# Patient Record
Sex: Female | Born: 1980 | Race: White | Hispanic: No | Marital: Married | Smoking: Never smoker
Health system: Southern US, Community
[De-identification: ages and names within clinical notes are randomized; demographics above are authoritative.]

## PROBLEM LIST (undated history)

## (undated) ENCOUNTER — Inpatient Hospital Stay (HOSPITAL_COMMUNITY): Payer: Self-pay

## (undated) DIAGNOSIS — R519 Headache, unspecified: Secondary | ICD-10-CM

## (undated) DIAGNOSIS — F419 Anxiety disorder, unspecified: Secondary | ICD-10-CM

## (undated) DIAGNOSIS — E7212 Methylenetetrahydrofolate reductase deficiency: Secondary | ICD-10-CM

## (undated) DIAGNOSIS — K649 Unspecified hemorrhoids: Secondary | ICD-10-CM

## (undated) DIAGNOSIS — E119 Type 2 diabetes mellitus without complications: Secondary | ICD-10-CM

## (undated) DIAGNOSIS — E282 Polycystic ovarian syndrome: Secondary | ICD-10-CM

## (undated) DIAGNOSIS — D649 Anemia, unspecified: Secondary | ICD-10-CM

## (undated) DIAGNOSIS — K219 Gastro-esophageal reflux disease without esophagitis: Secondary | ICD-10-CM

## (undated) DIAGNOSIS — Z1589 Genetic susceptibility to other disease: Secondary | ICD-10-CM

## (undated) DIAGNOSIS — M1990 Unspecified osteoarthritis, unspecified site: Secondary | ICD-10-CM

## (undated) DIAGNOSIS — N200 Calculus of kidney: Secondary | ICD-10-CM

## (undated) DIAGNOSIS — R51 Headache: Secondary | ICD-10-CM

## (undated) DIAGNOSIS — G8929 Other chronic pain: Secondary | ICD-10-CM

## (undated) DIAGNOSIS — K5909 Other constipation: Secondary | ICD-10-CM

## (undated) DIAGNOSIS — O24419 Gestational diabetes mellitus in pregnancy, unspecified control: Secondary | ICD-10-CM

## (undated) DIAGNOSIS — R5382 Chronic fatigue, unspecified: Secondary | ICD-10-CM

## (undated) DIAGNOSIS — D126 Benign neoplasm of colon, unspecified: Secondary | ICD-10-CM

## (undated) HISTORY — DX: Other chronic pain: G89.29

## (undated) HISTORY — DX: Anemia, unspecified: D64.9

## (undated) HISTORY — DX: Headache: R51

## (undated) HISTORY — PX: MANDIBLE SURGERY: SHX707

## (undated) HISTORY — DX: Chronic fatigue, unspecified: R53.82

## (undated) HISTORY — DX: Unspecified hemorrhoids: K64.9

## (undated) HISTORY — DX: Gestational diabetes mellitus in pregnancy, unspecified control: O24.419

## (undated) HISTORY — DX: Headache, unspecified: R51.9

## (undated) HISTORY — DX: Other constipation: K59.09

## (undated) HISTORY — PX: DILATION AND CURETTAGE OF UTERUS: SHX78

## (undated) HISTORY — DX: Unspecified osteoarthritis, unspecified site: M19.90

## (undated) HISTORY — DX: Methylenetetrahydrofolate reductase deficiency: E72.12

## (undated) HISTORY — DX: Genetic susceptibility to other disease: Z15.89

## (undated) HISTORY — DX: Benign neoplasm of colon, unspecified: D12.6

## (undated) HISTORY — DX: Type 2 diabetes mellitus without complications: E11.9

## (undated) HISTORY — PX: UPPER GASTROINTESTINAL ENDOSCOPY: SHX188

## (undated) HISTORY — PX: COLONOSCOPY: SHX174

## (undated) HISTORY — DX: Anxiety disorder, unspecified: F41.9

## (undated) HISTORY — PX: KNEE ARTHROSCOPY: SUR90

## (undated) HISTORY — DX: Calculus of kidney: N20.0

---

## 1999-02-26 ENCOUNTER — Ambulatory Visit (HOSPITAL_COMMUNITY): Admission: RE | Admit: 1999-02-26 | Discharge: 1999-02-26 | Payer: Self-pay | Admitting: Family Medicine

## 1999-02-26 ENCOUNTER — Encounter: Payer: Self-pay | Admitting: Family Medicine

## 1999-10-13 ENCOUNTER — Ambulatory Visit (HOSPITAL_COMMUNITY): Admission: RE | Admit: 1999-10-13 | Discharge: 1999-10-13 | Payer: Self-pay | Admitting: Specialist

## 1999-10-13 ENCOUNTER — Encounter: Payer: Self-pay | Admitting: Specialist

## 2006-01-19 ENCOUNTER — Other Ambulatory Visit: Admission: RE | Admit: 2006-01-19 | Discharge: 2006-01-19 | Payer: Self-pay | Admitting: Obstetrics and Gynecology

## 2006-06-16 ENCOUNTER — Ambulatory Visit (HOSPITAL_COMMUNITY): Admission: RE | Admit: 2006-06-16 | Discharge: 2006-06-16 | Payer: Self-pay | Admitting: Oral Surgery

## 2007-01-27 ENCOUNTER — Other Ambulatory Visit: Admission: RE | Admit: 2007-01-27 | Discharge: 2007-01-27 | Payer: Self-pay | Admitting: Obstetrics and Gynecology

## 2007-09-30 ENCOUNTER — Ambulatory Visit: Payer: Self-pay | Admitting: Internal Medicine

## 2008-02-07 ENCOUNTER — Other Ambulatory Visit: Admission: RE | Admit: 2008-02-07 | Discharge: 2008-02-07 | Payer: Self-pay | Admitting: Obstetrics and Gynecology

## 2008-03-06 DIAGNOSIS — T7840XA Allergy, unspecified, initial encounter: Secondary | ICD-10-CM | POA: Insufficient documentation

## 2008-03-06 DIAGNOSIS — N200 Calculus of kidney: Secondary | ICD-10-CM | POA: Insufficient documentation

## 2008-03-06 DIAGNOSIS — K5900 Constipation, unspecified: Secondary | ICD-10-CM | POA: Insufficient documentation

## 2008-03-06 DIAGNOSIS — F411 Generalized anxiety disorder: Secondary | ICD-10-CM | POA: Insufficient documentation

## 2008-03-06 DIAGNOSIS — R519 Headache, unspecified: Secondary | ICD-10-CM | POA: Insufficient documentation

## 2008-03-06 DIAGNOSIS — R51 Headache: Secondary | ICD-10-CM | POA: Insufficient documentation

## 2008-03-09 ENCOUNTER — Ambulatory Visit (HOSPITAL_BASED_OUTPATIENT_CLINIC_OR_DEPARTMENT_OTHER): Admission: RE | Admit: 2008-03-09 | Discharge: 2008-03-09 | Payer: Self-pay | Admitting: Family Medicine

## 2008-03-17 ENCOUNTER — Ambulatory Visit: Payer: Self-pay | Admitting: Internal Medicine

## 2008-03-26 ENCOUNTER — Ambulatory Visit (HOSPITAL_BASED_OUTPATIENT_CLINIC_OR_DEPARTMENT_OTHER): Admission: RE | Admit: 2008-03-26 | Discharge: 2008-03-26 | Payer: Self-pay | Admitting: Family Medicine

## 2008-04-01 ENCOUNTER — Ambulatory Visit: Payer: Self-pay | Admitting: Internal Medicine

## 2008-12-28 DIAGNOSIS — R5382 Chronic fatigue, unspecified: Secondary | ICD-10-CM

## 2008-12-28 HISTORY — DX: Chronic fatigue, unspecified: R53.82

## 2009-02-12 ENCOUNTER — Other Ambulatory Visit
Admission: RE | Admit: 2009-02-12 | Discharge: 2009-02-12 | Payer: Self-pay | Admitting: Physical Medicine & Rehabilitation

## 2009-03-22 ENCOUNTER — Encounter: Admission: RE | Admit: 2009-03-22 | Discharge: 2009-03-22 | Payer: Self-pay | Admitting: Obstetrics and Gynecology

## 2009-05-01 ENCOUNTER — Encounter: Admission: RE | Admit: 2009-05-01 | Discharge: 2009-05-01 | Payer: Self-pay | Admitting: Family Medicine

## 2009-05-24 IMAGING — CT CT NECK W/ CM
4 of 5 series · 16 of 33 positions shown, 19 images · IV contrast (CONTRAST)
Comparison: None.

CLINICAL DATA: Cervical adenopathy detected on cranial MRI.

CT NECK WITH CONTRAST
TECHNIQUE: Multidetector CT imaging of the neck was performed with
intravenous contrast.
Contrast: 75 ml [id number].

[Series 3: soft · axial · 0.31mm/px · z∈[+236,+290]mm · 2 of 72 slices shown]
[im 18/72  soft-tissue]
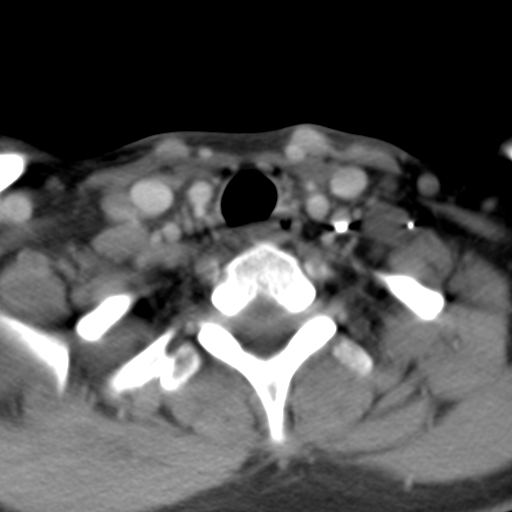
[im 36/72  soft-tissue]
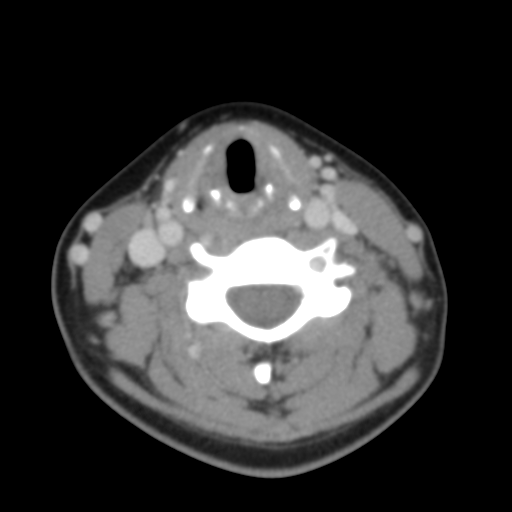

[coronals · coronal · 0.42mm/px · 3 of 69 slices shown]
[im 14/69  bone]
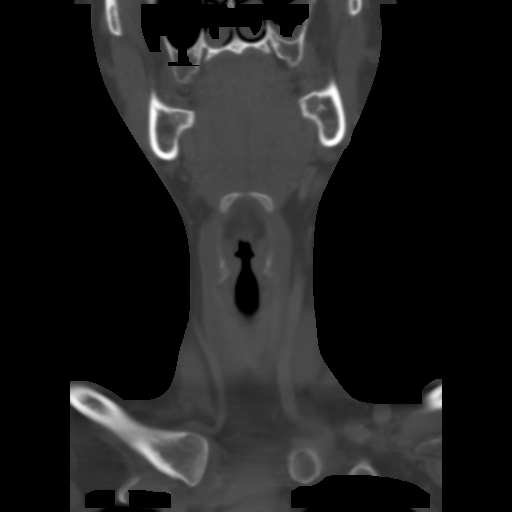
[im 28/69  bone]
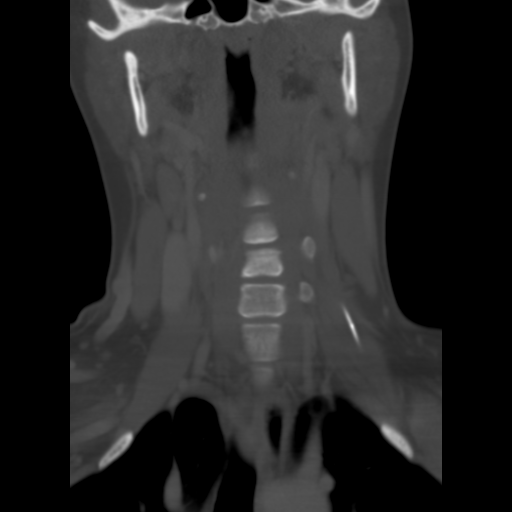
[im 41/69  bone]
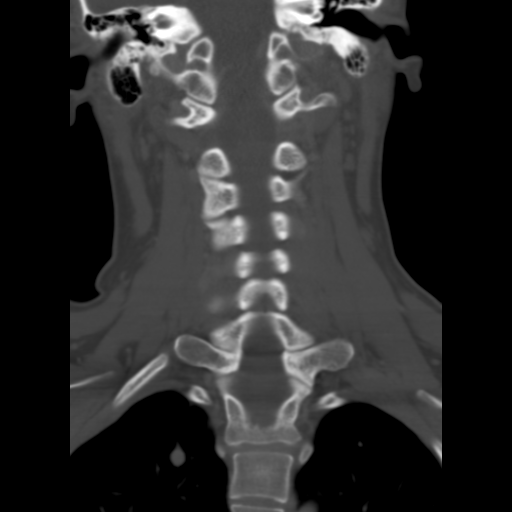

[sagitals · sagittal · 0.42mm/px · 5 of 78 slices shown, 6 images]
[im 26/78  bone]
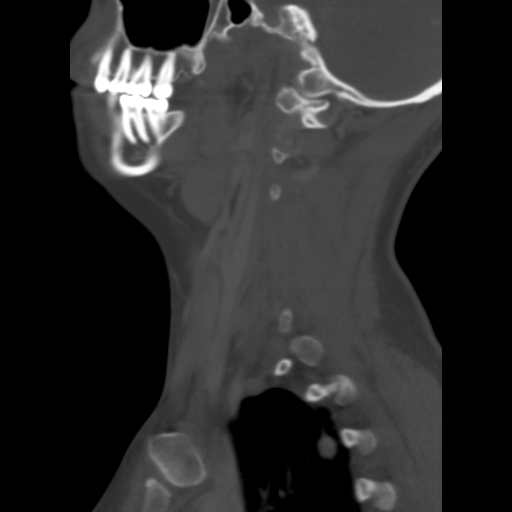
[im 33/78  bone]
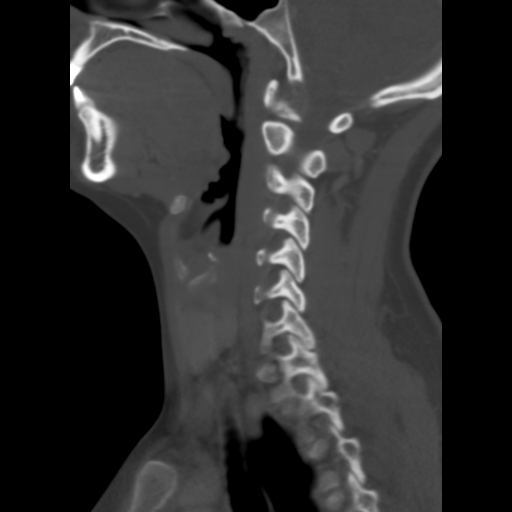
[im 39/78  soft-tissue]
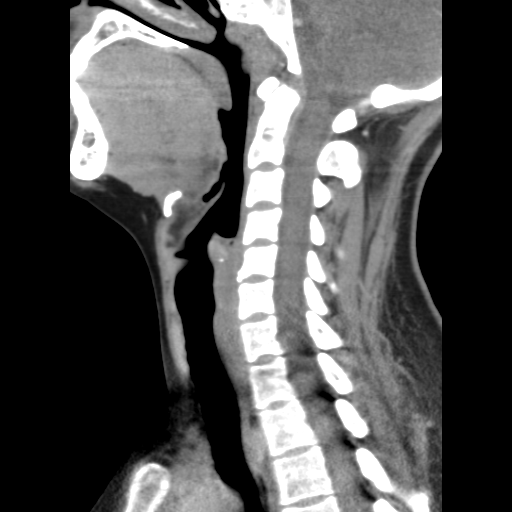
[im 39/78  bone]
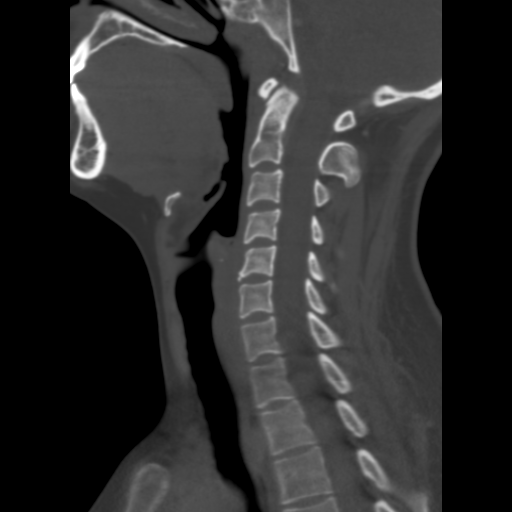
[im 45/78  bone]
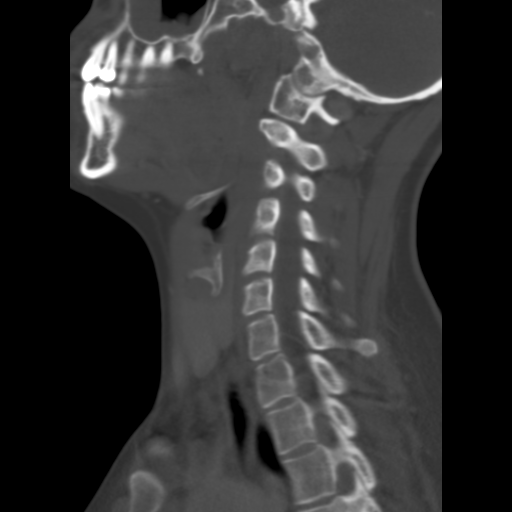
[im 52/78  bone]
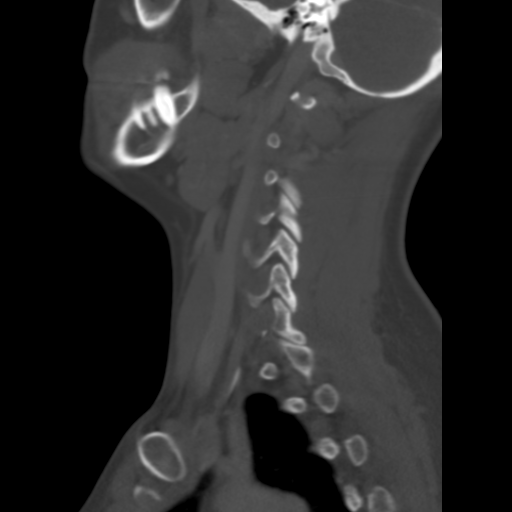

[angled axials · axial · 0.31mm/px · z∈[+209,+345]mm · 6 of 128 slices shown, 8 images]
[im 19/128  soft-tissue]
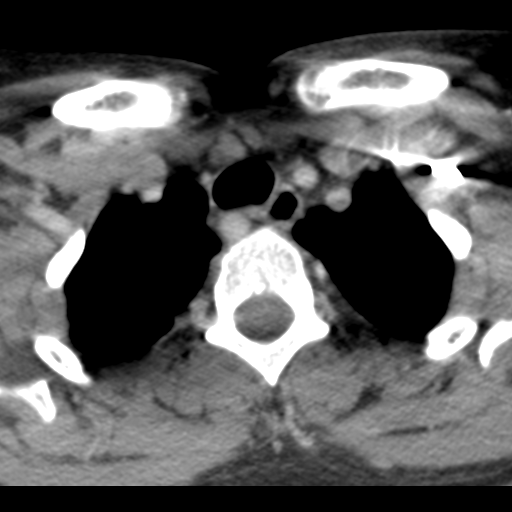
[im 19/128  bone]
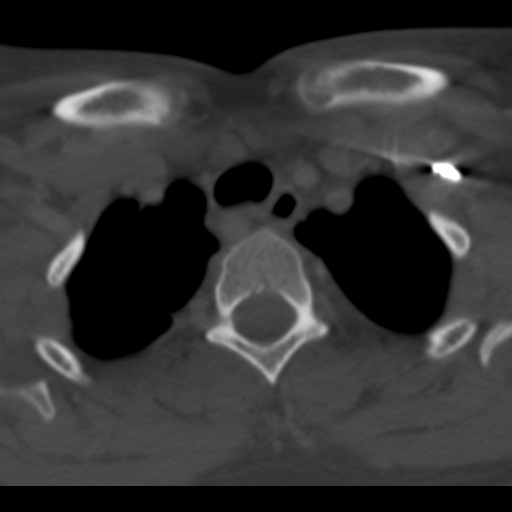
[im 37/128  bone]
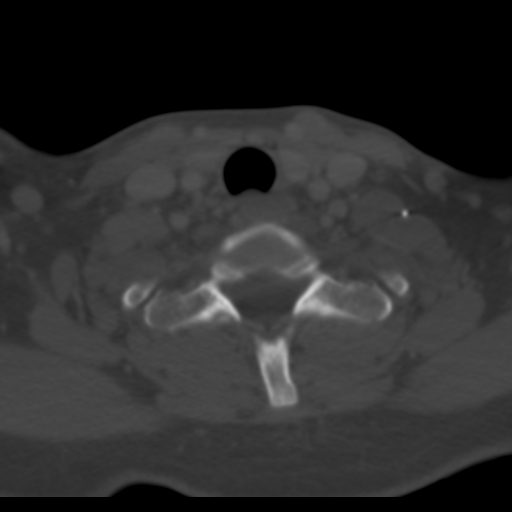
[im 55/128  bone]
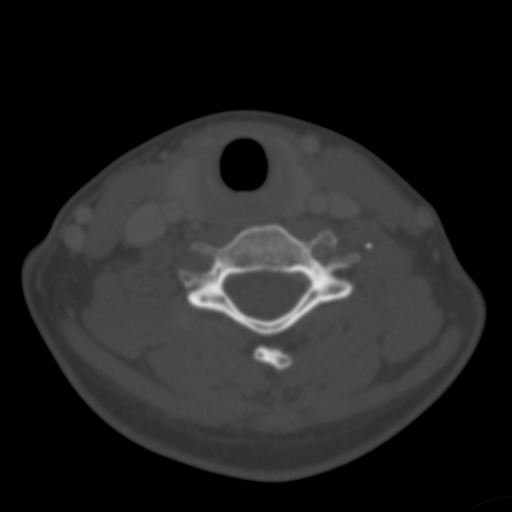
[im 73/128  bone]
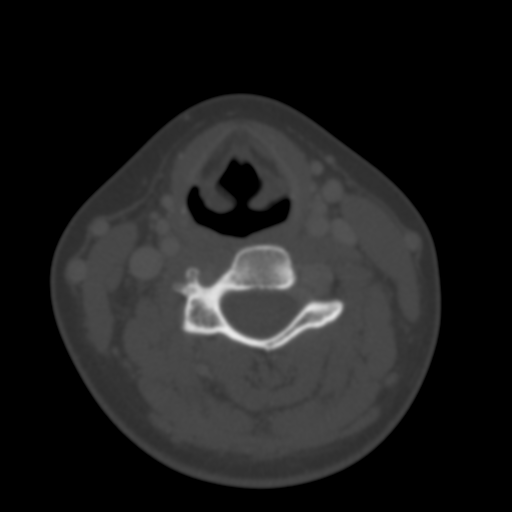
[im 91/128  soft-tissue]
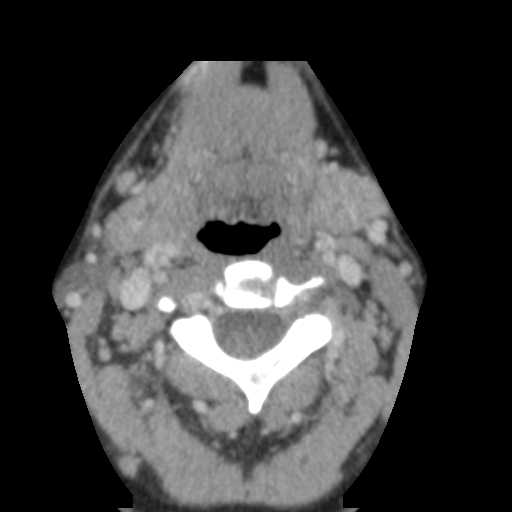
[im 91/128  bone]
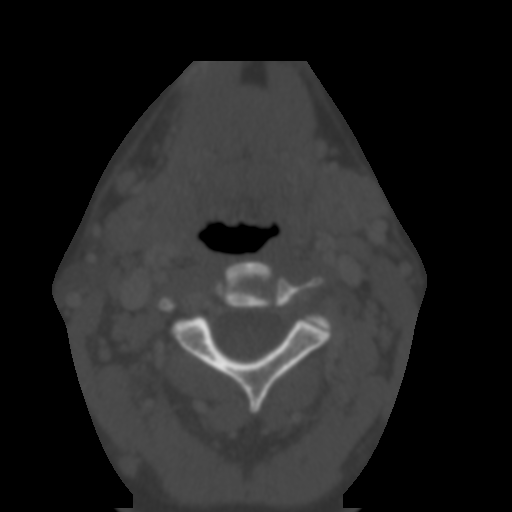
[im 109/128  bone]
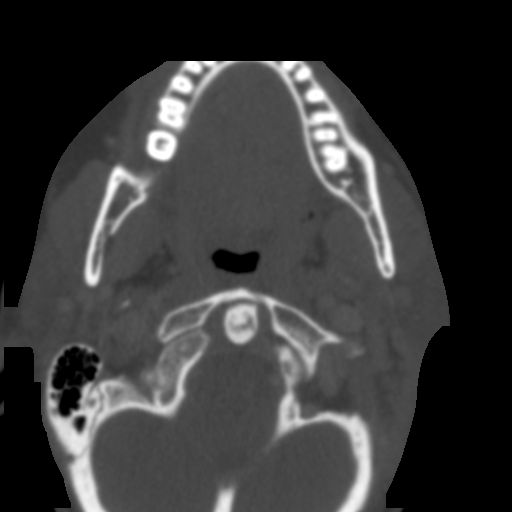

[16 of 33 positions shown; findings below may reference images not displayed]

FINDINGS: Scattered level II and level III lymph nodes.  No short
axis diameter exceeds 1 cm.  Airway widely patent.  No cervical
spondylosis.  No neck masses.  Lung apices clear.
IMPRESSION: There are no specific features which would suggest widespread level
II and level III lymphadenopathy is pathologic.  However, if there
are other areas of above which display abnormal lymphadenopathy
(axilla, inguinal), further evaluation may be warranted.  Correlate
clinically.

## 2010-02-14 ENCOUNTER — Other Ambulatory Visit: Admission: RE | Admit: 2010-02-14 | Discharge: 2010-02-14 | Payer: Self-pay | Admitting: Obstetrics and Gynecology

## 2011-02-26 ENCOUNTER — Other Ambulatory Visit (HOSPITAL_COMMUNITY)
Admission: RE | Admit: 2011-02-26 | Discharge: 2011-02-26 | Disposition: A | Discharge status: Home / Self Care | Payer: 59 | Source: Ambulatory Visit | Attending: Obstetrics and Gynecology | Admitting: Obstetrics and Gynecology

## 2011-02-26 DIAGNOSIS — Z113 Encounter for screening for infections with a predominantly sexual mode of transmission: Secondary | ICD-10-CM | POA: Insufficient documentation

## 2011-02-26 DIAGNOSIS — Z01419 Encounter for gynecological examination (general) (routine) without abnormal findings: Secondary | ICD-10-CM | POA: Insufficient documentation

## 2011-03-26 ENCOUNTER — Inpatient Hospital Stay (HOSPITAL_COMMUNITY): Admission: AD | Admit: 2011-03-26 | Payer: 59 | Source: Ambulatory Visit | Admitting: Obstetrics and Gynecology

## 2011-03-26 ENCOUNTER — Other Ambulatory Visit (HOSPITAL_COMMUNITY): Payer: Self-pay | Admitting: Obstetrics and Gynecology

## 2011-03-26 DIAGNOSIS — IMO0002 Reserved for concepts with insufficient information to code with codable children: Secondary | ICD-10-CM

## 2011-03-27 ENCOUNTER — Ambulatory Visit (HOSPITAL_COMMUNITY)
Admission: RE | Admit: 2011-03-27 | Discharge: 2011-03-27 | Disposition: A | Discharge status: Home / Self Care | Payer: 59 | Source: Ambulatory Visit | Attending: Obstetrics and Gynecology | Admitting: Obstetrics and Gynecology

## 2011-03-27 ENCOUNTER — Other Ambulatory Visit (HOSPITAL_COMMUNITY): Payer: Self-pay | Admitting: Obstetrics and Gynecology

## 2011-03-27 DIAGNOSIS — O368390 Maternal care for abnormalities of the fetal heart rate or rhythm, unspecified trimester, not applicable or unspecified: Secondary | ICD-10-CM | POA: Insufficient documentation

## 2011-03-27 DIAGNOSIS — IMO0002 Reserved for concepts with insufficient information to code with codable children: Secondary | ICD-10-CM

## 2011-03-27 DIAGNOSIS — Z3689 Encounter for other specified antenatal screening: Secondary | ICD-10-CM | POA: Insufficient documentation

## 2011-03-31 ENCOUNTER — Ambulatory Visit (HOSPITAL_COMMUNITY)
Admission: RE | Admit: 2011-03-31 | Discharge: 2011-03-31 | Disposition: A | Discharge status: Home / Self Care | Payer: 59 | Source: Ambulatory Visit | Attending: Obstetrics and Gynecology | Admitting: Obstetrics and Gynecology

## 2011-03-31 ENCOUNTER — Other Ambulatory Visit: Payer: Self-pay | Admitting: Obstetrics and Gynecology

## 2011-03-31 DIAGNOSIS — O021 Missed abortion: Secondary | ICD-10-CM | POA: Insufficient documentation

## 2011-03-31 LAB — CBC
HCT: 39 % (ref 36.0–46.0)
Hemoglobin: 13.7 g/dL (ref 12.0–15.0)
MCV: 88.2 fL (ref 78.0–100.0)
RBC: 4.42 MIL/uL (ref 3.87–5.11)
WBC: 9.3 10*3/uL (ref 4.0–10.5)

## 2011-04-15 NOTE — Op Note (Signed)
  Melinda Smith, Melinda Smith                ACCOUNT NO.:  [id number]  MEDICAL RECORD NO.:  [id number]           PATIENT TYPE:  O  LOCATION:  [facility]                          [facility]AC[facility]  PHYSICIAN:  Gerald Leitz, MD          DATE OF BIRTH:  05/12/1981  DATE OF PROCEDURE:  03/31/2011 DATE OF DISCHARGE:                              OPERATIVE REPORT   PREOPERATIVE DIAGNOSIS:  First trimester missed abortion.  POSTOPERATIVE DIAGNOSIS:  First trimester missed abortion.  PROCEDURE:  Dilation and suction curettage.  SURGEON:  Gerald Leitz, MD.  ANESTHESIA:  MAC.  ASSISTANT:  None.  FINDINGS:  Products of conception.  SPECIMEN:  Products of conception.  DISPOSITION OF SPECIMEN:  Pathology.  ESTIMATED BLOOD LOSS:  250 mL.  COMPLICATIONS:  None.  PROCEDURE:  The patient is taken to the operating room where she was placed under MAC anesthesia, placed in the dorsal lithotomy position, prepped and draped in the usual sterile fashion.  In-and-out catheterization was performed, 100 mL of urine was obtained.  Speculum was placed in the vaginal vault.  The anterior lip of the cervix was grasped with single-tooth tenaculum.  A 10 mL of 0.25% Marcaine were injected at the 4 and 8 o'clock positions.  The uterus was then sounded to 11 cm.  It was then dilated to approximately 10 mm and a 10 mm suction curette was introduced.  Suction curettage was performed with removal of suspected products of conception.  Sharp curette was then used until a gritty texture was noted and suction curette was then reintroduced and any remaining products were removed.  The patient was noted to have some moderate amount of bleeding and was given 0.2 mg of Methergine during the procedure.  At the end of procedure, the single- tooth tenaculum was removed.  Bleeding was noted and silver nitrate was applied.  The patient was taken to the recovery room awake and in stable condition and sponge, lap, and needle counts were  correct x2.     Gerald Leitz, MD     TC/MEDQ  D:  03/31/2011  T:  25-Apr-2011  Job:  [id number]  Electronically Signed by Gerald Leitz MD on 04/15/2011 08:38:42 AM

## 2011-04-28 DEATH — deceased

## 2011-05-12 NOTE — Procedures (Signed)
NAME:  MATTESON, BLUE                ACCOUNT NO.:  [id number]   MEDICAL RECORD NO.:  [id number]          PATIENT TYPE:  OUT   LOCATION:  [facility]                 FACILITY:  [facility]   PHYSICIAN:  Clinton D. Maple Hudson, MD, FCCP, FACPDATE OF BIRTH:  11/12/1979   DATE OF STUDY:  03/09/2008                            NOCTURNAL POLYSOMNOGRAM   REFERRING PHYSICIAN:   REFERRING PHYSICIAN:  Ernestina Penna, M.D.   INDICATION FOR STUDY:  Hypersomnia with sleep apnea.   EPWORTH SLEEPINESS SCORE:  17/24.  BMI 28.  Weight 175 pounds.  Height 5  feet 6 inches.  Neck 14 inches.   MEDICATIONS:  Home medications charted and reviewed.   SLEEP ARCHITECTURE:  Total sleep time 368 minutes with sleep efficiency  95.1%.  Stage I was 5.3%.  Stage II 68.5%.  Stage III 26.2%.  REM was  absent.  Sleep efficiency 8 minutes.  REM latency N/A.  Awake after  sleep onset 11 minutes.  Arousal index 44, which is increased.  No  bedtime medication was taken.   RESPIRATORY DATA:  Apnea-hypopnea index (AHI) 0.  No respiratory events  were scored as abnormal.   OXYGEN DATA:  Mild snoring with oxygen desaturation to a nadir of 96%.  Mean oxygen saturation through the study was 98.5% on room air.   CARDIAC DATA:  Normal sinus rhythm.   MOVEMENT-PARASOMNIA:  22 events were scored as periodic limb movement,  averaging 3.6 per hour, but none were associated with arousal from  sleep.  No bathroom trips.   IMPRESSIONS-RECOMMENDATIONS:  1. No significant respiratory disturbance, apnea-hypopnea index 0 per      hour with normal oxygenation.  Desaturating to a nadir of 96% and      mild snoring.  2. Occasional limb jerks, averaging 3.6 per hour, not associated with      disturbance from sleep.  3. Sleep architecture significant primarily for absence of rapid eye      movement (REM), which may reflect antidepressant therapy and is      likely to be an effect also of unfamiliar sleep environment.  4. If significant daytime  somnolence remains a problem not otherwise      explained, consider the possibility of primary hypersomnia or      narcolepsy, and consider return for Multiple Sleep Latency Test.      Clinton D. Maple Hudson, MD, [facility], FACP  Diplomate, Biomedical engineer of Sleep Medicine  Electronically Signed     CDY/MEDQ  D:  03/17/2008 09:22:13  T:  03/17/2008 09:46:44  Job:  [id number]

## 2011-05-12 NOTE — Assessment & Plan Note (Signed)
[facility]                         GASTROENTEROLOGY OFFICE NOTE   NAME:CONLEY, Zarai                         MRN:          [id number]  DATE:09/30/2007                            DOB:          14-Feb-1981    NEW PATIENT EVALUATION:  Melinda Smith is a 30 year old white female, who comes  here to consult about her constipation.  She has had tendency to be  constipated since high school and has taken over-the-counter laxatives  without much results.  She is currently drinking about six to eight  glasses of water a day and tries to eat high-fiber diet, but going over  her dietary habits, I do not see where she is really taking a little  fiber.  For breakfast, she drinks Carnation breakfast.  For lunch, she  brings to work peanut butter and jelly sandwich, and she usually has  been eating at home, which may have vegetables.  She also, at times,  takes Fiber One bar, which contains about 8-9 g of fiber.  She works out  about three to four times a week, but despite it, has gained about 10  pounds.  Her bowel habits occur every three days up to once a week.  It  is usually painful.  It is difficult to evacuate and she feels like she  is not quite finished.  Over the past two weeks, there has been mild  abdominal pain in periumbilical area.  She took over-the-counter  magnesium citrate last week, but the effects of it worked up to 24  hours.   MEDICATIONS:  Yasmin and Cymbalta 60 mg daily.   PAST HISTORY:  Significant for kidney stones in 2000, allergies, chronic  headaches, anxiety.  She had left arthroscopic knee surgery times four,  jaw surgery.   FAMILY HISTORY:  Positive for diabetes in maternal grandmother, colon  cancer in maternal great grandmother, alcoholism in both grandparents.   SOCIAL HISTORY:  Single, lives with parents.  She has a bachelor's  degree and works as a Nurse, adult.  She does not smoke, does not drink  alcohol.   REVIEW OF SYSTEMS:   Weight-gain ten pounds, allergies, sleeping  problems, back pain.   PHYSICAL EXAM:  Blood pressure 100/70, pulse 80, and weight 167 pounds.  She was healthy-appearing, in no distress.  SKIN:  Warm and dry.  NECK:  Supple.  Thyroid was not enlarged.  LUNGS:  Clear to auscultation.  COR:  With quiet precordium, normal S1 and normal S2.  ABDOMEN:  Soft, flat with good muscle support.  Normoactive bowel  sounds.  No distention, no focal tenderness.  Liver edge at costal  margin.  RECTAL EXAM:  Normal perianal area.  Rectal tone was normal.  Rectal  ampulla was completely empty.  The mucus was heme-negative.   IMPRESSION:  Patient is a 30 year old white female with what sounds like  a functional constipation, most likely due to slow transit time.  May be  due to under-stimulation.  She needs to eat more fiber.  There is  nothing to suggest obstruction or inflammatory bowel disease.  There is  a positive family history of colon cancer in a remote relative.  Because  of her age and essentially negative exam, she does not need a  colonoscopy.   PLAN:  1. High-fiber diet.  I have instructed her and gave her an outline of      the high-fiber diet.  2. Add Benefiber to her Carnation breakfast in the morning.  This will      add at least 5 g of fiber a day.  3. Start MiraLax 9-17 g daily or every other day.  She can titrate the      dose to have the bowel movements regulated.   I will be happy to see her over the next six months, should she have  further issues with her bowel movements.     Hedwig Morton. Juanda Chance, MD  Electronically Signed    DMB/MedQ  DD: 09/30/2007  DT: 09/30/2007  Job #: [id number]   cc:   Molly Maduro A. Nicholos Johns, M.D.

## 2011-05-12 NOTE — Procedures (Signed)
NAME:  Melinda Smith, Melinda Smith                ACCOUNT NO.:  [id number]   MEDICAL RECORD NO.:  [id number]          PATIENT TYPE:  OUT   LOCATION:  SLEEP CENTER                 FACILITY:  [facility]   PHYSICIAN:  Clinton D. Maple Hudson, MD, FCCP, FACPDATE OF BIRTH:  [birth year]-May-1979   DATE OF STUDY:  03/26/2008                          MULTIPLE SLEEP LATENCY TEST   REFERRING PHYSICIAN:  Ernestina Penna, M.D.   INDICATION FOR STUDY:  Hypersomnia, question of narcolepsy.   EPWORTH SLEEPINESS SCORE:  17/24.   BMI:  28.2.  Weight 175 pounds height 66 inches, neck 15 inches.   MEDICATIONS:  Home medications are charted and reviewed.  The patient  had not been instructed to stay off of her antidepressant Cymbalta,  which is capable of suppressing REM sleep pertinent to interpretation of  an MSLT.  A baseline diagnostic NPSG on March 09, 2008, had recorded an  AHI of 0 per hour with a total of 368 minutes of sleep.  Sleep  efficiency of 95.1%, and no REM.   Sleep architecture:   NAP 1:  8:00 a.m., sleep latency 0, REM absent.   NAP 2:  10:00 a.m. sleep latency 0, REM absent.   NAP 3:  12 noon, sleep latency 0, REM absent.   NAP 4:  2:00 p.m., sleep latency 0, REM absent.   NAP 5:  4:00 p.m. sleep latency 0, REM absent.    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  This study is nonspecific for particular cause in the absence of  cataplexy for sleep onset REM.  Note, that she had taken an  antidepressant which is likely to suppress REM.  The study, therefore  confirms the presence of severe daytime hypersomnia, but does not  distinguish between narcolepsy and a nonspecific primary disorder of  excessive somnolence such as etiopathic hypersomnia.      Clinton D. Maple Hudson, MD, [facility], FACP  Diplomate, Biomedical engineer of Sleep Medicine  Electronically Signed     CDY/MEDQ  D:  03/31/2008 13:58:44  T:  03/31/2008  14:45:11  Job:  [id number]

## 2011-07-08 ENCOUNTER — Telehealth: Payer: Self-pay | Admitting: Internal Medicine

## 2011-07-08 NOTE — Telephone Encounter (Signed)
Please send Anusol HC supp #12, Insert 1 hs, 1 refill.

## 2011-07-08 NOTE — Telephone Encounter (Signed)
Patient reports starting on Saturday, she started noticing pain with bowel movement and tiny amount of bright, red blood on tissue when she wipes. States the pain is inside the rectum.  Denies constipation. States her stools are soft. She is being treated for a UTI but is sure the blood is from rectum. 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 Patient instructed to use baby wipes instead of tissue, sitz baths. Hx constipation, family hx of colon cancer. Last OV 2008 for constipation.

## 2011-07-09 MED ORDER — HYDROCORTISONE ACETATE 25 MG RE SUPP: RECTAL | Status: DC

## 2011-07-09 NOTE — Telephone Encounter (Signed)
Left a message for patient to call me back.

## 2011-07-09 NOTE — Telephone Encounter (Signed)
Spoke with patient and gave her Dr. Regino Schultze recommendations. Rx sent to [facility].

## 2011-08-17 ENCOUNTER — Telehealth: Payer: Self-pay | Admitting: Internal Medicine

## 2011-08-17 NOTE — Telephone Encounter (Signed)
Patient would like to be seen for painful bowel movements for 6 weeks. She tried the [facility] and this helped a little but wants to be seen. Scheduled patient on 09/02/11 at 3:00 PM.

## 2011-08-24 ENCOUNTER — Other Ambulatory Visit: Payer: Self-pay | Admitting: *Deleted

## 2011-08-24 MED ORDER — HYDROCORTISONE ACETATE 25 MG RE SUPP: RECTAL | Status: DC

## 2011-09-01 ENCOUNTER — Other Ambulatory Visit: Payer: 59

## 2011-09-01 ENCOUNTER — Ambulatory Visit (INDEPENDENT_AMBULATORY_CARE_PROVIDER_SITE_OTHER): Payer: 59 | Admitting: Internal Medicine

## 2011-09-01 ENCOUNTER — Encounter: Payer: Self-pay | Admitting: Internal Medicine

## 2011-09-01 VITALS — BP 100/70 | HR 76 | Ht 66.25 in | Wt 138.0 lb

## 2011-09-01 DIAGNOSIS — K648 Other hemorrhoids: Secondary | ICD-10-CM

## 2011-09-01 DIAGNOSIS — K626 Ulcer of anus and rectum: Secondary | ICD-10-CM

## 2011-09-01 MED ORDER — DOCUSATE SODIUM 100 MG PO CAPS: ORAL_CAPSULE | ORAL | Status: DC

## 2011-09-01 MED ORDER — HYDROCORTISONE ACE-PRAMOXINE 2.5-1 % RE CREA: TOPICAL_CREAM | Freq: Two times a day (BID) | RECTAL | Status: DC | PRN

## 2011-09-01 NOTE — Progress Notes (Signed)
Melinda Smith [facility] 08-09-1981 MRN [id number]     History of Present Illness:  This is a 30 year old white female with chronic constipation. She was seen in our office in October 2008 for constipation and was started on MiraLax and fiber supplements. About 2 months ago, she developed sharp rectal pain with bowel movements and small amounts of blood with wiping. Her last bowel movement was one week ago. Her job is sedentary. There is a family history of colon cancer in her great-grandmother. Her brother was just diagnosed with celiac disease. She describes painful bowel movements as they pass through the rectum. She also has some pain after the bowel movement has been completed. We have treated her with Anusol-HC suppositories with marked improvement in the rectal pain but some residual discomfort in the rectal area.   Past Medical History  Diagnosis Date  . Anxiety   . Chronic headaches   . Kidney stone    Past Surgical History  Procedure Date  . Knee arthroscopy     left x 4  . Mandible surgery   . Dilation and curettage of uterus     reports that she has never smoked. She has never used smokeless tobacco. She reports that she does not drink alcohol or use illicit drugs. family history includes Alcohol abuse in an unspecified family member; Colon cancer in an unspecified family member; and Diabetes in her maternal grandmother. No Known Allergies      Review of Systems: Denies abdominal pain. Denies gastroesophageal reflux dysphagia chest pain  The remainder of the 10  point ROS is negative except as outlined in H&P   Physical Exam: General appearance  Well developed, in no distress. Eyes- non icteric. HEENT nontraumatic, normocephalic. Mouth no lesions, tongue papillated, no cheilosis. Neck supple without adenopathy, thyroid not enlarged, no carotid bruits, no JVD. Lungs Clear to auscultation bilaterally. Cor normal S1 normal S2, regular rhythm , no murmur,  quiet  precordium. Abdomen soft, nondistended abdomen with normal active bowel sounds. Palpable stool in the left colon, no tenderness or mass. Rectal and anoscopic exam reveals small mixed hemorrhoids within the anal canal measuring 5 mm in diameter, nontender, no evidence of thrombosis. No other hemorrhoids seen. There is no anal fissure. Exam is tolerated well. Rectal ampulla appears normal,small amount of Hemoccult negative stool. Extremities no pedal edema. Skin no lesions. Neurological alert and oriented x 3. Psychological normal mood and affect.  Assessment and Plan:  Problem #1 Rectal pain is now improved after the use of Anusol-HC suppositories and is likely attributed to either symptomatic hemorrhoids or to anal fissure. She has had chronic constipation which was previously evaluated. She has not followed up with taking MiraLax. She needs to get back on stool softeners which she prefers from taking MiraLax. She will start Colace 2 tablets daily and a high fiber diet. We will give her Analpram cream 2.5% to use twice a day for rectal irritation. I recommend she sit in a comfortable chair in her office. She will check with [place] in 3-6 months.   09/01/2011 Lina Sar

## 2011-09-01 NOTE — Patient Instructions (Addendum)
We have sent the following medications to your pharmacy for you to pick up at your convenience: Analpram Colace Your physician has requested that you go to the basement for the following lab work before leaving today: TtG CC:Dr Robert Reade  High-Fiber Diet A high-fiber diet changes your normal diet to include more whole grains, legumes, fruits, and vegetables. Changes in the diet involve replacing refined carbohydrates with unrefined foods. The calorie level of the diet is essentially unchanged. The Dietary Reference Intake (recommended amount) for adult males is 38 grams per day. For adult females, it is 25 grams per day. Pregnant and lactating women should consume 28 grams of fiber per day. Fiber is the intact part of a plant that is not broken down during digestion. Functional fiber is fiber that has been isolated from the plant to provide a beneficial effect in the body. PURPOSE  Increase stool bulk.   Ease and regulate bowel movements.   Lower cholesterol.  INDICATIONS THAT YOU NEED MORE FIBER  Constipation and hemorrhoids.   Uncomplicated diverticulosis (intestine condition) and irritable bowel syndrome.   Weight management.   As a protective measure against hardening of the arteries (atherosclerosis), diabetes, and cancer.  NOTE OF CAUTION If you have a digestive or bowel problem, ask your caregiver for advice before adding high-fiber foods to your diet. Some of the following medical problems are such that a high-fiber diet should not be used without consulting your caregiver. DO NOT USE WITH:  Acute diverticulitis (intestine infection).   Partial small bowel obstructions.   Complicated diverticular disease involving bleeding, rupture (perforation), or abscess (boil, furuncle).   Presence of autonomic neuropathy (nerve damage) or gastric paresis (stomach cannot empty itself).  GUIDELINES FOR INCREASING FIBER IN THE DIET  Start adding fiber to the diet slowly. A gradual  increase of about 5 more grams (2 slices of whole-wheat bread, 2 servings of most fruits or vegetables, or 1 bowl of high-fiber cereal) per day is best. Too rapid an increase in fiber may result in constipation, flatulence, and bloating.   Drink enough water and fluids to keep your urine clear or pale yellow. Water, juice, or caffeine-free drinks are recommended. Not drinking enough fluid may cause constipation.   Eat a variety of high-fiber foods rather than one type of fiber.   Try to increase your intake of fiber through using high-fiber foods rather than fiber pills or supplements that contain small amounts of fiber.   The goal is to change the types of food eaten. Do not supplement your present diet with high-fiber foods, but replace foods in your present diet.  INCLUDE A VARIETY OF FIBER SOURCES  Replace refined and processed grains with whole grains, canned fruits with fresh fruits, and incorporate other fiber sources. White rice, white breads, and most bakery goods contain little or no fiber.   Brown whole-grain rice, buckwheat oats, and many fruits and vegetables are all good sources of fiber. These include: broccoli, Brussels sprouts, cabbage, cauliflower, beets, sweet potatoes, white potatoes (skin on), carrots, tomatoes, eggplant, squash, berries, fresh fruits, and dried fruits.   Cereals appear to be the richest source of fiber. Cereal fiber is found in whole grains and bran. Bran is the fiber-rich outer coat of cereal grain, which is largely removed in refining. In whole-grain cereals, the bran remains. In breakfast cereals, the largest amount of fiber is found in those with "bran" in their names. The fiber content is sometimes indicated on the label.   You may  need to include additional fruits and vegetables each day.   In baking, for 1 cup white flour, you may use the following substitutions:   1 cup whole-wheat flour minus 2 tablespoons.   1/2 cup white flour plus 1/2 cup  whole-wheat flour.  References: Dietary Reference Intakes: Recommended Intakes for Individuals. [employer]. Institute of Medicine. Food and Nutrition Board. Document Released: 12/14/2005 Document Re-Released: 03/10/2010 [facility] Patient Information 2011 [place], [place].

## 2011-09-02 ENCOUNTER — Encounter: Payer: Self-pay | Admitting: Internal Medicine

## 2011-09-03 ENCOUNTER — Telehealth: Payer: Self-pay | Admitting: *Deleted

## 2011-09-03 NOTE — Telephone Encounter (Signed)
Left a message for patient to call me. 

## 2011-09-03 NOTE — Telephone Encounter (Signed)
Message copied by Daphine Deutscher on Thu Sep 03, 2011  2:17 PM ------      Message from: Hart Carwin      Created: Thu Sep 03, 2011 12:46 PM       Please call pt with negative celiac test.

## 2011-09-03 NOTE — Telephone Encounter (Signed)
Patient notified of results as per Dr. Brodie. 

## 2011-11-25 ENCOUNTER — Other Ambulatory Visit: Payer: Self-pay | Admitting: Internal Medicine

## 2012-03-11 ENCOUNTER — Other Ambulatory Visit (HOSPITAL_COMMUNITY)
Admission: RE | Admit: 2012-03-11 | Discharge: 2012-03-11 | Disposition: A | Discharge status: Home / Self Care | Payer: 59 | Source: Ambulatory Visit | Attending: Obstetrics and Gynecology | Admitting: Obstetrics and Gynecology

## 2012-03-11 ENCOUNTER — Other Ambulatory Visit: Payer: Self-pay | Admitting: Obstetrics and Gynecology

## 2012-03-11 DIAGNOSIS — Z113 Encounter for screening for infections with a predominantly sexual mode of transmission: Secondary | ICD-10-CM | POA: Insufficient documentation

## 2012-03-11 DIAGNOSIS — Z01419 Encounter for gynecological examination (general) (routine) without abnormal findings: Secondary | ICD-10-CM | POA: Insufficient documentation

## 2013-02-20 ENCOUNTER — Encounter: Payer: Self-pay | Admitting: Internal Medicine

## 2013-02-20 ENCOUNTER — Telehealth: Payer: Self-pay | Admitting: Internal Medicine

## 2013-02-20 ENCOUNTER — Ambulatory Visit (INDEPENDENT_AMBULATORY_CARE_PROVIDER_SITE_OTHER): Payer: 59 | Admitting: Physician Assistant

## 2013-02-20 ENCOUNTER — Other Ambulatory Visit (INDEPENDENT_AMBULATORY_CARE_PROVIDER_SITE_OTHER): Payer: 59

## 2013-02-20 ENCOUNTER — Encounter: Payer: Self-pay | Admitting: Physician Assistant

## 2013-02-20 VITALS — BP 120/78 | HR 76 | Ht 66.25 in | Wt 142.0 lb

## 2013-02-20 DIAGNOSIS — K5900 Constipation, unspecified: Secondary | ICD-10-CM

## 2013-02-20 DIAGNOSIS — K5909 Other constipation: Secondary | ICD-10-CM

## 2013-02-20 DIAGNOSIS — K6289 Other specified diseases of anus and rectum: Secondary | ICD-10-CM

## 2013-02-20 DIAGNOSIS — K625 Hemorrhage of anus and rectum: Secondary | ICD-10-CM

## 2013-02-20 LAB — CBC WITH DIFFERENTIAL/PLATELET
Basophils Relative: 0.3 % (ref 0.0–3.0)
Eosinophils Absolute: 0 10*3/uL (ref 0.0–0.7)
Eosinophils Relative: 0.9 % (ref 0.0–5.0)
Hemoglobin: 12.9 g/dL (ref 12.0–15.0)
MCHC: 34.9 g/dL (ref 30.0–36.0)
MCV: 88.7 fl (ref 78.0–100.0)
Monocytes Absolute: 0.3 10*3/uL (ref 0.1–1.0)
Neutro Abs: 3.2 10*3/uL (ref 1.4–7.7)
RBC: 4.18 Mil/uL (ref 3.87–5.11)
WBC: 5.1 10*3/uL (ref 4.5–10.5)

## 2013-02-20 MED ORDER — LIDOCAINE-HYDROCORTISONE ACE 3-2.5 % RE KIT: PACK | RECTAL | Status: DC

## 2013-02-20 MED ORDER — MOVIPREP 100 G PO SOLR: 1.0000 | Freq: Once | ORAL | Status: AC

## 2013-02-20 MED ORDER — LIDOCAINE-HYDROCORTISONE ACE 3-0.5 % RE KIT: PACK | RECTAL | Status: DC

## 2013-02-20 NOTE — Progress Notes (Signed)
Reviewed and agree with prep, labs and colonoscopy

## 2013-02-20 NOTE — Progress Notes (Signed)
Subjective:    Patient ID: Melinda Smith, female    DOB: 12/17/1981, 32 y.o.   MRN: [id number]  HPI Melinda Smith is a very nice 32 year old white female known to Dr. Lina Sar. She was last seen in 2012. She has history of chronic constipation, and she had been seen at that time for rectal discomfort and pain secondary to mixed hemorrhoids. She has not had prior colonoscopy. She comes in today because of worsening problems with constipation and intermittent impactions which are generally associated with rectal pain and bleeding. Patient relates history of constipation dating back to childhood but says that her symptoms have been worse over the past 2 years. She says she is lucky to have 2 bowel movements a week. She has been trying to manage the constipation with fiber primarily in says that laxatives haven't worked that well in the past. She tried Clinical biochemist but says she did not have very good results in the past. She complains of ongoing problems with bloating especially after meals, and says sometimes she can actually tell that her colon is full of stool because she can palpate a loop of bowel in her abdomen. Over the past 2 week and she has had increased rectal pressure and significant difficulty evacuating her bowels with hard impacted stool. She had an episode this weekend with a lot of straining etc. and then says she gets rectal bleeding and discomfort with bowel movements and after bowel movements. She has family history of colon cancer in a maternal great-grandmother and brother with celiac disease.    Review of Systems  Constitutional: Negative.   HENT: Negative.   Eyes: Negative.   Respiratory: Negative.   Cardiovascular: Negative.   Gastrointestinal: Positive for constipation, anal bleeding and rectal pain.  Endocrine: Negative.   Genitourinary: Negative.   Allergic/Immunologic: Negative.   Neurological: Negative.   Psychiatric/Behavioral: Negative.    Outpatient Prescriptions Prior to  Visit  Medication Sig Dispense Refill  . Drospirenone-Ethinyl Estradiol-Levomefol (SAFYRAL) 3-0.03-0.451 MG tablet Take 1 tablet by mouth daily.        . hydrocortisone (ANUSOL-HC) 25 MG suppository Insert one rectally nightly  12 suppository  0  . docusate sodium (COLACE) 100 MG capsule Take 2 tablets by mouth once daily.  60 capsule  2  . hydrocortisone-pramoxine (ANALPRAM-HC) 2.5-1 % rectal cream PLACE RECTALLY TWICE A DAY AS NEEDED  30 g  0   No facility-administered medications prior to visit.   No Known Allergies Patient Active Problem List  Diagnosis  . ANXIETY  . CONSTIPATION  . RENAL CALCULUS  . HEADACHE, CHRONIC  . ALLERGY   History  Substance Use Topics  . Smoking status: Never Smoker   . Smokeless tobacco: Never Used  . Alcohol Use: No     family history includes Alcohol abuse in an unspecified family member; Celiac disease in her brother; Colon cancer in an unspecified family member; and Diabetes in her maternal grandmother.   Objective:   Physical Exam well-developed young white female in no acute distress, pleasant blood pressure 120/78 pulse 76 height 5 foot 6 weight 142. HEENT; nontraumatic normocephalic EOMI PERRLA sclera anicteric,Neck; Supple no JVD, Cardiovascular; regular rate and rhythm with S1-S2 no murmur or gallop, Pulmonary; clear bilaterally, Abdomen; soft bowel sounds are active she is minimally tender in the left lower quadrant no palpable mass or hepatosplenomegaly, Rectal; exam not done, patient declines currently, Extremities; no clubbing, cyanosis, or edema skin warm dry, Psych; mood and affect normal and appropriate.  Assessment & Plan:  #70 32 year old female with chronic constipation, progressive over the past 2 years and associated with intermittent impactions. Suspect chronic functional constipation/slow transit. Rule out IBD or occult lesion #2 intermittent rectal pain and bleeding-likely secondary to hemorrhoids or intermittent  fissures #3 family history of colon cancer-great-grandmother #4 family history of celiac disease in a sibling  Plan; will check TSH, celiac panel and CBC Purge bowel with a Suprep, then start trial of Linzess 145 mcg by mouth daily-he will be given a couple of weeks worth of samples and if this is effective we'll give prescription Schedule for colonoscopy with Dr. Chauncy Passy discussed in detail with the patient and she is agreeable to proceed. Analpram 2.5% cream applied 3-4 times daily as needed

## 2013-02-20 NOTE — Telephone Encounter (Signed)
Pt last seen 09/01/11 for painful BMs, hx of internal hemorrhoids, ulcer of anus/rectum, chronic constipation. She was improved at OV d/t Anusol HC suppositories; colace bid was ordered, hi fiber diet and analpram . Today, pt reports she still has problems with constipation; takes Benefiber with each meal and follows a hi fiber diet. She reports she had to disimpact herself yesterday and has been on a liquid diet since because BMs are so painful. She also reports BRB on tissue and in toilet bowl when she has a BM. Pt will see Mike Gip, PA. Today.

## 2013-02-20 NOTE — Patient Instructions (Addendum)
We have given you a Suprep to use an evening this week for a bowel purge. You have been scheduled for a colonoscopy with propofol. Please follow written instructions given to you at your visit today.  Please pick up your prep kit at the pharmacy within the next 1-3 days. If you use inhalers (even only as needed) or a CPAP machine, please bring them with you on the day of your procedure.  We have given you a coupon for a $20 rebate on the colonoscopy prep, Moviprep.  We have given you samples of Linzess , take 1 daily for constipation. Use after the bowel purge. We sent the prescription for the Anamantle rectal cream to [facility].

## 2013-02-20 NOTE — Progress Notes (Signed)
  Subjective:    Patient ID: Melinda Smith, female    DOB: Oct 07, 1981, 32 y.o.   MRN: [id number]  HPI    Review of Systems     Objective:   Physical Exam        Assessment & Plan:

## 2013-02-21 LAB — CELIAC PANEL 10
Endomysial Screen: NEGATIVE
Gliadin IgA: 5.1 U/mL (ref <20)
Gliadin IgG: 6.8 U/mL (ref <20)
IgA: 161 mg/dL (ref 69–380)
Tissue Transglut Ab: 10.7 U/mL (ref <20)
Tissue Transglutaminase Ab, IgA: 5.2 U/mL (ref <20)

## 2013-03-14 ENCOUNTER — Encounter: Payer: Self-pay | Admitting: Internal Medicine

## 2013-03-14 ENCOUNTER — Ambulatory Visit (AMBULATORY_SURGERY_CENTER): Payer: 59 | Admitting: Internal Medicine

## 2013-03-14 VITALS — BP 97/61 | HR 66 | Temp 97.6°F | Resp 16 | Ht 66.0 in | Wt 142.0 lb

## 2013-03-14 DIAGNOSIS — D126 Benign neoplasm of colon, unspecified: Secondary | ICD-10-CM

## 2013-03-14 DIAGNOSIS — K6289 Other specified diseases of anus and rectum: Secondary | ICD-10-CM

## 2013-03-14 DIAGNOSIS — K5900 Constipation, unspecified: Secondary | ICD-10-CM

## 2013-03-14 MED ORDER — HYDROCORTISONE ACETATE 25 MG RE SUPP: 25.0000 mg | Freq: Every evening | RECTAL | Status: DC | PRN

## 2013-03-14 MED ORDER — SODIUM CHLORIDE 0.9 % IV SOLN: 500.0000 mL | INTRAVENOUS | Status: DC

## 2013-03-14 NOTE — Op Note (Signed)
[facility] [address] [place], [address]   COLONOSCOPY PROCEDURE REPORT  PATIENT: Melinda Smith, Melinda Smith  MR#: [id number] BIRTHDATE: 10/09/1981 , 31  yrs. old GENDER: Female ENDOSCOPIST: Hart Carwin, MD REFERRED BY:  Rudi Heap, M.D. PROCEDURE DATE:  03/14/2013 PROCEDURE:   Colonoscopy with snare polypectomy ASA CLASS:   Class I INDICATIONS:anal bleeding.  ,constipation, rectal pain, GP with colon cancer, brother with celiac disease MEDICATIONS: MAC sedation, administered by CRNA and propofol (Diprivan) 400mg  IV  DESCRIPTION OF PROCEDURE:   After the risks and benefits and of the procedure were explained, informed consent was obtained.  A digital rectal exam revealed no abnormalities of the rectum.    The LB [id number]  endoscope was introduced through the anus and advanced to the cecum, which was identified by both the appendix and ileocecal valve .  The quality of the prep was excellent, using MoviPrep .  The instrument was then slowly withdrawn as the colon was fully examined.     COLON FINDINGS: An ulcerated and polypoid shaped semi-pedunculated polyp measuring 15 mm in size with a friable surface was found in the descending colon. at 50 cm, A polypectomy was performed with a cold snare. endo clip was placed over the stalk base, polyp had a blood clot adherent to its surface.  The resection was complete and the polyp tissue was completely retrieved.     Retroflexed views revealed no abnormalities.     The scope was then withdrawn from the patient and the procedure completed.  COMPLICATIONS: There were no complications. ENDOSCOPIC IMPRESSION: Semi-pedunculated polyp measuring 15 mm in size was found in the descending colon; polypectomy was performed with a cold snare , polyp shws stigmata of recent bleeding no significant hemorrhoids  RECOMMENDATIONS: 1.  Await pathology results 2.  High fiber diet 3.  Miralx prn constipation   REPEAT  EXAM: In 5 year(s)  for Colonoscopy.  cc:  _______________________________ eSignedHart Carwin, MD 03/14/2013 4:29 PM     PATIENT NAME:  Melinda Smith, Melinda Smith MR#: [id number]

## 2013-03-14 NOTE — Patient Instructions (Addendum)
Discharge instructions given with verbal understanding. Handout on polyps given. Resume previous medications. YOU HAD AN ENDOSCOPIC PROCEDURE TODAY AT THE [facility]: Refer to the procedure report that was given to you for any specific questions about what was found during the examination.  If the procedure report does not answer your questions, please call your gastroenterologist to clarify.  If you requested that your care partner not be given the details of your procedure findings, then the procedure report has been included in a sealed envelope for you to review at your convenience later.  YOU SHOULD EXPECT: Some feelings of bloating in the abdomen. Passage of more gas than usual.  Walking can help get rid of the air that was put into your GI tract during the procedure and reduce the bloating. If you had a lower endoscopy (such as a colonoscopy or flexible sigmoidoscopy) you may notice spotting of blood in your stool or on the toilet paper. If you underwent a bowel prep for your procedure, then you may not have a normal bowel movement for a few days.  DIET: Your first meal following the procedure should be a light meal and then it is ok to progress to your normal diet.  A half-sandwich or bowl of soup is an example of a good first meal.  Heavy or fried foods are harder to digest and may make you feel nauseous or bloated.  Likewise meals heavy in dairy and vegetables can cause extra gas to form and this can also increase the bloating.  Drink plenty of fluids but you should avoid alcoholic beverages for 24 hours.  ACTIVITY: Your care partner should take you home directly after the procedure.  You should plan to take it easy, moving slowly for the rest of the day.  You can resume normal activity the day after the procedure however you should NOT DRIVE or use heavy machinery for 24 hours (because of the sedation medicines used during the test).    SYMPTOMS TO REPORT IMMEDIATELY: A  gastroenterologist can be reached at any hour.  During normal business hours, 8:30 AM to 5:00 PM Monday through Friday, call [phone].  After hours and on weekends, please call the GI answering service at [phone] who will take a message and have the physician on call contact you.   Following lower endoscopy (colonoscopy or flexible sigmoidoscopy):  Excessive amounts of blood in the stool  Significant tenderness or worsening of abdominal pains  Swelling of the abdomen that is new, acute  Fever of 100F or higher  FOLLOW UP: If any biopsies were taken you will be contacted by phone or by letter within the next 1-3 weeks.  Call your gastroenterologist if you have not heard about the biopsies in 3 weeks.  Our staff will call the home number listed on your records the next business day following your procedure to check on you and address any questions or concerns that you may have at that time regarding the information given to you following your procedure. This is a courtesy call and so if there is no answer at the home number and we have not heard from you through the emergency physician on call, we will assume that you have returned to your regular daily activities without incident.  SIGNATURES/CONFIDENTIALITY: You and/or your care partner have signed paperwork which will be entered into your electronic medical record.  These signatures attest to the fact that that the information above on your After Visit Summary has   been reviewed and is understood.  Full responsibility of the confidentiality of this discharge information lies with you and/or your care-partner. 

## 2013-03-14 NOTE — Progress Notes (Signed)
Called to room to assist during endoscopic procedure.  Patient ID and intended procedure confirmed with present staff. Received instructions for my participation in the procedure from the performing physician.  

## 2013-03-14 NOTE — Progress Notes (Signed)
Propofol given over incremental dosagesPropofol given over incremental dosagesLidocaine-40mg IV prior to Propofol Induction 

## 2013-03-14 NOTE — Progress Notes (Signed)
Patient did not experience any of the following events: a burn prior to discharge; a fall within the facility; wrong site/side/patient/procedure/implant event; or a hospital transfer or hospital admission upon discharge from the facility. (G8907) Patient did not have preoperative order for IV antibiotic SSI prophylaxis. (G8918)  

## 2013-03-15 ENCOUNTER — Telehealth: Payer: Self-pay | Admitting: *Deleted

## 2013-03-15 NOTE — Telephone Encounter (Signed)
  Follow up Call-  Call back number 03/14/2013  Post procedure Call Back phone  # [phone]  Permission to leave phone message Yes     Patient questions:  Do you have a fever, pain , or abdominal swelling? no Pain Score  0 *  Have you tolerated food without any problems? yes  Have you been able to return to your normal activities? yes  Do you have any questions about your discharge instructions: Diet   no Medications  no Follow up visit  no  Do you have questions or concerns about your Care? no  Actions: * If pain score is 4 or above: No action needed, pain <4.

## 2013-03-16 ENCOUNTER — Other Ambulatory Visit (HOSPITAL_COMMUNITY)
Admission: RE | Admit: 2013-03-16 | Discharge: 2013-03-16 | Disposition: A | Discharge status: Home / Self Care | Payer: 59 | Source: Ambulatory Visit | Attending: Obstetrics and Gynecology | Admitting: Obstetrics and Gynecology

## 2013-03-16 ENCOUNTER — Other Ambulatory Visit: Payer: Self-pay | Admitting: Nurse Practitioner

## 2013-03-16 DIAGNOSIS — Z113 Encounter for screening for infections with a predominantly sexual mode of transmission: Secondary | ICD-10-CM | POA: Insufficient documentation

## 2013-03-16 DIAGNOSIS — N760 Acute vaginitis: Secondary | ICD-10-CM | POA: Insufficient documentation

## 2013-03-16 DIAGNOSIS — Z01419 Encounter for gynecological examination (general) (routine) without abnormal findings: Secondary | ICD-10-CM | POA: Insufficient documentation

## 2013-03-16 DIAGNOSIS — Z1151 Encounter for screening for human papillomavirus (HPV): Secondary | ICD-10-CM | POA: Insufficient documentation

## 2013-03-21 ENCOUNTER — Encounter: Payer: Self-pay | Admitting: Internal Medicine

## 2013-03-22 ENCOUNTER — Encounter: Payer: Self-pay | Admitting: *Deleted

## 2013-03-23 ENCOUNTER — Encounter: Payer: Self-pay | Admitting: *Deleted

## 2013-03-24 ENCOUNTER — Telehealth: Payer: Self-pay | Admitting: Internal Medicine

## 2013-03-24 MED ORDER — LINACLOTIDE 145 MCG PO CAPS: ORAL_CAPSULE | ORAL | Status: DC

## 2013-03-24 NOTE — Telephone Encounter (Signed)
OK to fill the prescription for Linzess  145ug, #30, 1 po qd, 6 refills. She does not have to come is she is much better, but is she is not, then we need to come up with  a better solution.

## 2013-03-24 NOTE — Telephone Encounter (Signed)
Spoke with patient and explained that her OV is to f/u on rectal pain and constipation. She is taking Linzess 145 mcg samples that Mike Gip, PA gave her and it seems to help a little. She is asking if she should continue this and that she needs rx sent if she needs to continue. Please, advise.

## 2013-03-24 NOTE — Telephone Encounter (Signed)
Left a message for patient to call me. 

## 2013-03-24 NOTE — Telephone Encounter (Signed)
Rx sent to pharmacy. Left a message for patient that rx was sent. Also left a message that if she was much better did not need to be OV but if not needs to keep it.

## 2013-04-25 ENCOUNTER — Ambulatory Visit (INDEPENDENT_AMBULATORY_CARE_PROVIDER_SITE_OTHER): Payer: 59 | Admitting: Internal Medicine

## 2013-04-25 ENCOUNTER — Encounter: Payer: Self-pay | Admitting: Internal Medicine

## 2013-04-25 VITALS — BP 104/64 | HR 74 | Ht 66.25 in | Wt 138.0 lb

## 2013-04-25 DIAGNOSIS — K5900 Constipation, unspecified: Secondary | ICD-10-CM

## 2013-04-25 DIAGNOSIS — K6289 Other specified diseases of anus and rectum: Secondary | ICD-10-CM

## 2013-04-25 MED ORDER — MAGNESIUM OXIDE 400 MG PO TABS: 800.0000 mg | ORAL_TABLET | Freq: Every day | ORAL | Status: DC

## 2013-04-25 NOTE — Patient Instructions (Addendum)
When you get ready to run out of your Linzess prescription, please contact your pharmacy. We will refill at that time.  Please purchase the following medications over the counter and take as directed: Magnesium Oxide 400 mg tablet-Take 2 tablets by mouth daily as needed.  Please follow up with Dr Juanda Chance in 1 year.  CC: Dr Rudi Heap

## 2013-04-25 NOTE — Progress Notes (Signed)
Melinda Smith May 05, 1981 MRN [id number]   History of Present Illness:  This is a 31 year old white female with chronic constipation resulting in severe rectal pain. She underwent a colonoscopy in March 2014 with findings of a tubular adenoma. She was started on Linzess 145 mcg daily. Her bowel habits are much improved especially since she was on 2 separate courses of antibiotics for a urinary tract infection and for a skin infection. She is just starting back on the Linzess. She will be  due for a recall colonoscopy in 5 years. Her brother has celiac disease and her grandfather had colon cancer. She is currently satisfied with her bowel habits.   Past Medical History  Diagnosis Date  . Anxiety   . Chronic headaches   . Chronic constipation   . Kidney stone   . Chronic fatigue 2010  . Hemorrhoids   . Tubular adenoma of colon    Past Surgical History  Procedure Laterality Date  . Knee arthroscopy      left x 4  . Mandible surgery    . Dilation and curettage of uterus      reports that she has never smoked. She has never used smokeless tobacco. She reports that she does not drink alcohol or use illicit drugs. family history includes Alcohol abuse in an unspecified family member; Celiac disease in her brother; Colon cancer in an unspecified family member; Diabetes in her maternal grandmother; and Diverticulosis in her mother. No Known Allergies      Review of Systems: Denies rectal bleeding abdominal pain  The remainder of the 10 point ROS is negative except as outlined in H&P   Physical Exam: General appearance  Well developed, in no distress. Psychological normal mood and affect.  Assessment and Plan:  Problem #9 32 year old white female with slow transit constipation resulting in rectal pain. She is currently much improved on Linzess 145 mcg daily. In case of recurrent constipation, she will use magnesium oxide 400 mg 1-2 tablets daily. She says that she already tried  Miralax and it did not work. She is to stay on a high fiber diet although she has limited her lactose intake as well as gluten intake and seems to do well on it. The sprue profile was negative. As far as her tubular adenoma is concerned, she would be due for a recall colonoscopy in 5 years. Her siblings do not need to have a colonoscopy before age 74. I will see her in one year.    04/25/2013 Lina Sar

## 2014-12-28 NOTE — L&D Delivery Note (Signed)
Delivery Note At 3:36 PM a viable and healthy female was delivered via Vaginal, Spontaneous Delivery (Presentation: Left Occiput Anterior).  APGAR: 9, 9; weight  pending.   Placenta status: Intact, Spontaneous.  Cord: 3 vessels with the following complications: None.  Cord pH: na  Anesthesia: Epidural  Episiotomy: None Lacerations: 2nd degree;Perineal Suture Repair: 2.0 vicryl rapide Est. Blood Loss (mL):  250  Mom to postpartum.  Baby to Couplet care / Skin to Skin.  [name] J 09/09/2015, 3:51 PM

## 2015-02-05 LAB — OB RESULTS CONSOLE GC/CHLAMYDIA
Chlamydia: NEGATIVE
Gonorrhea: NEGATIVE

## 2015-02-05 LAB — OB RESULTS CONSOLE HEPATITIS B SURFACE ANTIGEN: Hepatitis B Surface Ag: NEGATIVE

## 2015-02-05 LAB — OB RESULTS CONSOLE HIV ANTIBODY (ROUTINE TESTING): HIV: NONREACTIVE

## 2015-02-05 LAB — OB RESULTS CONSOLE ABO/RH: RH TYPE: POSITIVE

## 2015-02-05 LAB — OB RESULTS CONSOLE ANTIBODY SCREEN: Antibody Screen: NEGATIVE

## 2015-02-05 LAB — OB RESULTS CONSOLE RUBELLA ANTIBODY, IGM: Rubella: IMMUNE

## 2015-02-05 LAB — OB RESULTS CONSOLE RPR: RPR: NONREACTIVE

## 2015-03-04 ENCOUNTER — Inpatient Hospital Stay (HOSPITAL_COMMUNITY)
Admission: AD | Admit: 2015-03-04 | Discharge: 2015-03-04 | Disposition: A | Discharge status: Home / Self Care | Payer: 59 | Source: Ambulatory Visit | Attending: Obstetrics | Admitting: Obstetrics

## 2015-03-04 ENCOUNTER — Encounter (HOSPITAL_COMMUNITY): Payer: Self-pay | Admitting: *Deleted

## 2015-03-04 DIAGNOSIS — O09521 Supervision of elderly multigravida, first trimester: Secondary | ICD-10-CM | POA: Diagnosis not present

## 2015-03-04 DIAGNOSIS — Z3A12 12 weeks gestation of pregnancy: Secondary | ICD-10-CM | POA: Insufficient documentation

## 2015-03-04 DIAGNOSIS — O26851 Spotting complicating pregnancy, first trimester: Secondary | ICD-10-CM | POA: Diagnosis not present

## 2015-03-04 DIAGNOSIS — K5900 Constipation, unspecified: Secondary | ICD-10-CM | POA: Insufficient documentation

## 2015-03-04 DIAGNOSIS — N898 Other specified noninflammatory disorders of vagina: Secondary | ICD-10-CM | POA: Diagnosis present

## 2015-03-04 HISTORY — DX: Polycystic ovarian syndrome: E28.2

## 2015-03-04 HISTORY — DX: Gastro-esophageal reflux disease without esophagitis: K21.9

## 2015-03-04 LAB — URINALYSIS, ROUTINE W REFLEX MICROSCOPIC
Bilirubin Urine: NEGATIVE
GLUCOSE, UA: NEGATIVE mg/dL
Hgb urine dipstick: NEGATIVE
KETONES UR: NEGATIVE mg/dL
LEUKOCYTES UA: NEGATIVE
NITRITE: NEGATIVE
PROTEIN: NEGATIVE mg/dL
Specific Gravity, Urine: 1.015 (ref 1.005–1.030)
UROBILINOGEN UA: 0.2 mg/dL (ref 0.0–1.0)
pH: 5.5 (ref 5.0–8.0)

## 2015-03-04 NOTE — MAU Note (Signed)
Pt reports some blood in her vaginal discharge today. Denies pain.

## 2015-03-04 NOTE — H&P (Signed)
Chief complaint: Blood tinged vaginal discharge  History present illness: 34 year old G3 P0 020 at 12 weeks who presents with scant red tinge to vaginal discharge starting at 6 PM today. Patient reports no odor, no pain, no itching. No vaginal bleeding. Patient reports Rh is positive. Patient reports no abdominal pain other than a slight lateral abdominal cramp with stretching. Patient does note a history of chronic constipation. Patient notes no fevers, no dysuria.  Past medical history: MAB 2, chronic constipation, MTH FR homozygous, PCO S  Medications: Prenatal vitamin, folic acid, metformin  No known drug allergies  Physical exam:  Filed Vitals:   03/04/15 1931 03/04/15 2045  BP: 104/55 105/58  Pulse: 72 82  Temp: 98.3 F (36.8 C)   TempSrc: Oral   Resp: 16 18  Height: 5\' 6"  (1.676 m)   Weight: 70.761 kg (156 lb)   SpO2: 100%    Gen.: Alert, no distress Abdomen: Nontender, no masses Back: No costovertebral angle tenderness GU: Normal external genitalia, normal cervix with ectropion on the entire face of the external cervical os, no active bleeding though some irritability at that ectropion consistent with recent spotting, normal vagina, cervix long and closed, no uterine tenderness, no adnexal tenderness Lower extremity, nontender, no edema  Wet prep: Normal epithelial cells, no red blood cells, no white blood cells, no clue cells, no yeast, no sperm, no trichomonas  FHTs: Positive per nurse  Assessment and plan: 34 year old G3 P0 020 at 12 weeks with scant spotting from cervical ectropion Rh+, no program needed Bleeding consistent with cervical ectropion, reassurance given History of MAB 2, MTH far homozygote, on folic acid and prenatal vitamin. No current miscarriage with positive fetal heart tones and cervix which is long and closed. Reassurance has been given to the patient Round ligament pain Chronic constipation. Over-the-counter remedies suggested with active  hydration  Orlando Thalmann A. 03/04/2015 8:52 PM

## 2015-08-21 LAB — OB RESULTS CONSOLE GBS: STREP GROUP B AG: NEGATIVE

## 2015-09-04 ENCOUNTER — Telehealth (HOSPITAL_COMMUNITY): Payer: Self-pay | Admitting: *Deleted

## 2015-09-04 ENCOUNTER — Encounter (HOSPITAL_COMMUNITY): Payer: Self-pay | Admitting: *Deleted

## 2015-09-04 NOTE — Telephone Encounter (Signed)
Preadmission screen  

## 2015-09-06 ENCOUNTER — Other Ambulatory Visit: Payer: Self-pay | Admitting: Obstetrics and Gynecology

## 2015-09-08 ENCOUNTER — Other Ambulatory Visit: Payer: Self-pay | Admitting: Obstetrics and Gynecology

## 2015-09-08 NOTE — H&P (Signed)
Melinda Smith is a 34 y.o. female presenting for IOL for Marginal CI.  Maternal Medical History:  Contractions: Onset was less than 1 hour ago.   Perceived severity is mild.    Fetal activity: Perceived fetal activity is normal.   Last perceived fetal movement was within the past hour.    Prenatal complications: Placental abnormality.   Prenatal Complications - Diabetes: none.    OB History    Gravida Para Term Preterm AB TAB SAB Ectopic Multiple Living   3    2  2         Past Medical History  Diagnosis Date  . Anxiety   . Chronic headaches   . Chronic constipation   . Kidney stone   . Chronic fatigue 2010  . Hemorrhoids   . Tubular adenoma of colon   . PCOS (polycystic ovarian syndrome)   . GERD (gastroesophageal reflux disease)   . 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 Knee arthroscopy      left x 4  . Mandible surgery    . Dilation and curettage of uterus     Family History: family history includes Alcohol abuse in an other family member; Celiac disease in her brother; Colon cancer in an other family member; Diabetes in her maternal grandmother; Diverticulosis in her mother; Multiple sclerosis in her mother and sister. Social History:  reports that she has never smoked. She has never used smokeless tobacco. She reports that she does not drink alcohol or use illicit drugs.   Prenatal Transfer Tool  Maternal Diabetes: No Genetic Screening: Normal Maternal Ultrasounds/Referrals: Normal Fetal Ultrasounds or other Referrals:  None Maternal Substance Abuse:  No Significant Maternal Medications:  None Significant Maternal Lab Results:  None Other Comments:  None  Review of Systems  All other systems reviewed and are negative.     There were no vitals taken for this visit. Maternal Exam:  Uterine Assessment: Contraction strength is mild.  Contraction frequency is rare.   Abdomen: Patient reports no abdominal  tenderness. Fetal presentation: vertex  Introitus: Normal vulva. Normal vagina.  Ferning test: not done.  Nitrazine test: not done. Amniotic fluid character: not assessed.  Pelvis: adequate for delivery.   Cervix: Cervix evaluated by digital exam.     Physical Exam  Nursing note and vitals reviewed. Constitutional: She is oriented to person, place, and time. She appears well-developed and well-nourished.  HENT:  Head: Normocephalic and atraumatic.  Neck: Normal range of motion. Neck supple.  Cardiovascular: Normal rate and regular rhythm.   Respiratory: Effort normal and breath sounds normal.  GI: Soft. Bowel sounds are normal.  Genitourinary: Vagina normal and uterus normal.  Musculoskeletal: Normal range of motion.  Neurological: She is alert and oriented to person, place, and time.  Skin: Skin is warm and dry.  Psychiatric: She has a normal mood and affect.    Prenatal labs: ABO, Rh: O/Positive/-- (02/09 0000) Antibody: Negative (02/09 0000) Rubella: Immune (02/09 0000) RPR: Nonreactive (02/09 0000)  HBsAg: Negative (02/09 0000)  HIV: Non-reactive (02/09 0000)  GBS: Negative (08/24 0000)   Assessment/Plan: Term IUP Marginal CI AMA IOL   Trevan Messman J 09/08/2015, 10:20 PM

## 2015-09-09 ENCOUNTER — Inpatient Hospital Stay (HOSPITAL_COMMUNITY)
Admission: RE | Admit: 2015-09-09 | Discharge: 2015-09-11 | DRG: 775 | Disposition: A | Discharge status: Home / Self Care | Payer: 59 | Source: Ambulatory Visit | Attending: Obstetrics and Gynecology | Admitting: Obstetrics and Gynecology

## 2015-09-09 ENCOUNTER — Inpatient Hospital Stay (HOSPITAL_COMMUNITY): Payer: 59 | Admitting: Anesthesiology

## 2015-09-09 ENCOUNTER — Encounter (HOSPITAL_COMMUNITY): Payer: Self-pay

## 2015-09-09 DIAGNOSIS — O43123 Velamentous insertion of umbilical cord, third trimester: Principal | ICD-10-CM | POA: Diagnosis present

## 2015-09-09 DIAGNOSIS — Z3A39 39 weeks gestation of pregnancy: Secondary | ICD-10-CM | POA: Diagnosis present

## 2015-09-09 DIAGNOSIS — K219 Gastro-esophageal reflux disease without esophagitis: Secondary | ICD-10-CM | POA: Diagnosis present

## 2015-09-09 DIAGNOSIS — O9962 Diseases of the digestive system complicating childbirth: Secondary | ICD-10-CM | POA: Diagnosis present

## 2015-09-09 DIAGNOSIS — IMO0002 Reserved for concepts with insufficient information to code with codable children: Secondary | ICD-10-CM | POA: Diagnosis present

## 2015-09-09 DIAGNOSIS — O09523 Supervision of elderly multigravida, third trimester: Secondary | ICD-10-CM | POA: Diagnosis not present

## 2015-09-09 LAB — TYPE AND SCREEN
ABO/RH(D): O POS
Antibody Screen: NEGATIVE

## 2015-09-09 LAB — CBC
HEMATOCRIT: 35 % — AB (ref 36.0–46.0)
HEMOGLOBIN: 12 g/dL (ref 12.0–15.0)
MCH: 31.4 pg (ref 26.0–34.0)
MCHC: 34.3 g/dL (ref 30.0–36.0)
MCV: 91.6 fL (ref 78.0–100.0)
Platelets: 194 10*3/uL (ref 150–400)
RBC: 3.82 MIL/uL — ABNORMAL LOW (ref 3.87–5.11)
RDW: 12.8 % (ref 11.5–15.5)
WBC: 10.6 10*3/uL — ABNORMAL HIGH (ref 4.0–10.5)

## 2015-09-09 LAB — ABO/RH: ABO/RH(D): O POS

## 2015-09-09 MED ORDER — PRENATAL MULTIVITAMIN CH
1.0000 | ORAL_TABLET | Freq: Every day | ORAL | Status: DC
  Administered 2015-09-10: 1 via ORAL
  Filled 2015-09-09: qty 1

## 2015-09-09 MED ORDER — OXYTOCIN 40 UNITS IN LACTATED RINGERS INFUSION - SIMPLE MED
62.5000 mL/h | INTRAVENOUS | Status: DC
  Administered 2015-09-09: 62.5 mL/h via INTRAVENOUS

## 2015-09-09 MED ORDER — OXYCODONE-ACETAMINOPHEN 5-325 MG PO TABS: 1.0000 | ORAL_TABLET | ORAL | Status: DC | PRN

## 2015-09-09 MED ORDER — DIPHENHYDRAMINE HCL 25 MG PO CAPS: 25.0000 mg | ORAL_CAPSULE | Freq: Four times a day (QID) | ORAL | Status: DC | PRN

## 2015-09-09 MED ORDER — TERBUTALINE SULFATE 1 MG/ML IJ SOLN
0.2500 mg | Freq: Once | INTRAMUSCULAR | Status: DC | PRN
  Filled 2015-09-09: qty 1

## 2015-09-09 MED ORDER — ACETAMINOPHEN 325 MG PO TABS: 650.0000 mg | ORAL_TABLET | ORAL | Status: DC | PRN

## 2015-09-09 MED ORDER — ONDANSETRON HCL 4 MG/2ML IJ SOLN: 4.0000 mg | INTRAMUSCULAR | Status: DC | PRN

## 2015-09-09 MED ORDER — ONDANSETRON HCL 4 MG PO TABS: 4.0000 mg | ORAL_TABLET | ORAL | Status: DC | PRN

## 2015-09-09 MED ORDER — OXYTOCIN BOLUS FROM INFUSION: 500.0000 mL | INTRAVENOUS | Status: DC

## 2015-09-09 MED ORDER — OXYCODONE-ACETAMINOPHEN 5-325 MG PO TABS: 2.0000 | ORAL_TABLET | ORAL | Status: DC | PRN

## 2015-09-09 MED ORDER — BENZOCAINE-MENTHOL 20-0.5 % EX AERO
1.0000 "application " | INHALATION_SPRAY | CUTANEOUS | Status: DC | PRN
  Administered 2015-09-10: 1 via TOPICAL
  Filled 2015-09-09: qty 56

## 2015-09-09 MED ORDER — LACTATED RINGERS IV SOLN: INTRAVENOUS | Status: DC

## 2015-09-09 MED ORDER — TETANUS-DIPHTH-ACELL PERTUSSIS 5-2.5-18.5 LF-MCG/0.5 IM SUSP: 0.5000 mL | Freq: Once | INTRAMUSCULAR | Status: DC

## 2015-09-09 MED ORDER — LIDOCAINE HCL (PF) 1 % IJ SOLN
30.0000 mL | INTRAMUSCULAR | Status: DC | PRN
  Filled 2015-09-09: qty 30

## 2015-09-09 MED ORDER — CITRIC ACID-SODIUM CITRATE 334-500 MG/5ML PO SOLN
30.0000 mL | ORAL | Status: DC | PRN
Start: 2015-09-09 — End: 2015-09-09

## 2015-09-09 MED ORDER — ZOLPIDEM TARTRATE 5 MG PO TABS: 5.0000 mg | ORAL_TABLET | Freq: Every evening | ORAL | Status: DC | PRN

## 2015-09-09 MED ORDER — LANOLIN HYDROUS EX OINT: TOPICAL_OINTMENT | CUTANEOUS | Status: DC | PRN

## 2015-09-09 MED ORDER — PHENYLEPHRINE 40 MCG/ML (10ML) SYRINGE FOR IV PUSH (FOR BLOOD PRESSURE SUPPORT)
80.0000 ug | PREFILLED_SYRINGE | INTRAVENOUS | Status: DC | PRN
  Filled 2015-09-09: qty 2
  Filled 2015-09-09: qty 20

## 2015-09-09 MED ORDER — ONDANSETRON HCL 4 MG/2ML IJ SOLN: 4.0000 mg | Freq: Four times a day (QID) | INTRAMUSCULAR | Status: DC | PRN

## 2015-09-09 MED ORDER — EPHEDRINE 5 MG/ML INJ
10.0000 mg | INTRAVENOUS | Status: DC | PRN
  Filled 2015-09-09: qty 2

## 2015-09-09 MED ORDER — OXYTOCIN 40 UNITS IN LACTATED RINGERS INFUSION - SIMPLE MED
1.0000 m[IU]/min | INTRAVENOUS | Status: DC
  Administered 2015-09-09: 2 m[IU]/min via INTRAVENOUS
  Filled 2015-09-09: qty 1000

## 2015-09-09 MED ORDER — METHYLERGONOVINE MALEATE 0.2 MG/ML IJ SOLN: 0.2000 mg | INTRAMUSCULAR | Status: DC | PRN

## 2015-09-09 MED ORDER — FENTANYL 2.5 MCG/ML BUPIVACAINE 1/10 % EPIDURAL INFUSION (WH - ANES)
14.0000 mL/h | INTRAMUSCULAR | Status: DC | PRN
  Administered 2015-09-09 (×2): 14 mL/h via EPIDURAL
  Filled 2015-09-09: qty 125

## 2015-09-09 MED ORDER — SENNOSIDES-DOCUSATE SODIUM 8.6-50 MG PO TABS
2.0000 | ORAL_TABLET | ORAL | Status: DC
  Administered 2015-09-09 – 2015-09-10 (×2): 2 via ORAL
  Filled 2015-09-09 (×2): qty 2

## 2015-09-09 MED ORDER — FLEET ENEMA 7-19 GM/118ML RE ENEM: 1.0000 | ENEMA | RECTAL | Status: DC | PRN

## 2015-09-09 MED ORDER — METHYLERGONOVINE MALEATE 0.2 MG PO TABS: 0.2000 mg | ORAL_TABLET | ORAL | Status: DC | PRN

## 2015-09-09 MED ORDER — IBUPROFEN 600 MG PO TABS
600.0000 mg | ORAL_TABLET | Freq: Four times a day (QID) | ORAL | Status: DC
  Administered 2015-09-09 – 2015-09-11 (×7): 600 mg via ORAL
  Filled 2015-09-09 (×6): qty 1

## 2015-09-09 MED ORDER — LACTATED RINGERS IV SOLN
500.0000 mL | INTRAVENOUS | Status: DC | PRN
  Administered 2015-09-09 (×2): 500 mL via INTRAVENOUS

## 2015-09-09 MED ORDER — DIBUCAINE 1 % RE OINT: 1.0000 "application " | TOPICAL_OINTMENT | RECTAL | Status: DC | PRN

## 2015-09-09 MED ORDER — DIPHENHYDRAMINE HCL 50 MG/ML IJ SOLN: 12.5000 mg | INTRAMUSCULAR | Status: DC | PRN

## 2015-09-09 MED ORDER — WITCH HAZEL-GLYCERIN EX PADS: 1.0000 "application " | MEDICATED_PAD | CUTANEOUS | Status: DC | PRN

## 2015-09-09 MED ORDER — SIMETHICONE 80 MG PO CHEW: 80.0000 mg | CHEWABLE_TABLET | ORAL | Status: DC | PRN

## 2015-09-09 MED ORDER — LIDOCAINE HCL (PF) 1 % IJ SOLN
INTRAMUSCULAR | Status: DC | PRN
  Administered 2015-09-09 (×2): 4 mL

## 2015-09-09 NOTE — Progress Notes (Signed)
Melinda Smith is a 34 y.o. G3P0020 at [redacted]w[redacted]d by LMP admitted for induction of labor due to marginal CI.  Subjective: Feels pressure  Objective: BP 125/64 mmHg  Pulse 72  Temp(Src) 98.1 F (36.7 C) (Oral)  Resp 16      FHT:  FHR: 145 bpm, variability: moderate,  accelerations:  Present,  decelerations:  Absent UC:   regular, every 2 minutes SVE:   Dilation: 10 Effacement (%): 90 Station: +1, +2 Exam by:: J.Thornton, RN  Labs: Lab Results  Component Value Date   WBC 10.6* 09/09/2015   HGB 12.0 09/09/2015   HCT 35.0* 09/09/2015   MCV 91.6 09/09/2015   PLT 194 09/09/2015    Assessment / Plan: Induction of labor due to marginal ci,  progressing well on pitocin  Labor: Progressing normally Preeclampsia:  no signs or symptoms of toxicity Fetal Wellbeing:  Category I Pain Control:  Epidural I/D:  n/a Anticipated MOD:  NSVD  Melinda Smith J 09/09/2015, 3:09 PM

## 2015-09-09 NOTE — Anesthesia Preprocedure Evaluation (Addendum)
Anesthesia Evaluation  Patient identified by MRN, date of birth, ID band Patient awake    Reviewed: Allergy & Precautions, NPO status , Patient's Chart, lab work & pertinent test results  History of Anesthesia Complications Negative for: history of anesthetic complications  Airway Mallampati: II  TM Distance: >3 FB Neck ROM: Full    Dental no notable dental hx. (+) Dental Advisory Given   Pulmonary neg pulmonary ROS,    Pulmonary exam normal breath sounds clear to auscultation       Cardiovascular negative cardio ROS Normal cardiovascular exam Rhythm:Regular Rate:Normal     Neuro/Psych  Headaches, PSYCHIATRIC DISORDERS Anxiety    GI/Hepatic Neg liver ROS, GERD  Medicated and Controlled,  Endo/Other  negative endocrine ROS  Renal/GU negative Renal ROS  negative genitourinary   Musculoskeletal negative musculoskeletal ROS (+)   Abdominal   Peds negative pediatric ROS (+)  Hematology negative hematology ROS (+)   Anesthesia Other Findings   Reproductive/Obstetrics (+) 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     Plan Discussed with: CRNA  Anesthesia Plan Comments:         Anesthesia Quick Evaluation

## 2015-09-09 NOTE — Anesthesia Procedure Notes (Signed)
Epidural Patient location during procedure: OB  Staffing Anesthesiologist: Tyreese Thain Performed by: anesthesiologist   Preanesthetic Checklist Completed: patient identified, site marked, surgical consent, pre-op evaluation, timeout performed, IV checked, risks and benefits discussed and monitors and equipment checked  Epidural Patient position: sitting Prep: site prepped and draped and DuraPrep Patient monitoring: continuous pulse ox and blood pressure Approach: midline Location: L3-L4 Injection technique: LOR saline  Needle:  Needle type: Tuohy  Needle gauge: 17 G Needle length: 9 cm and 9 Needle insertion depth: 5 cm cm Catheter type: closed end flexible Catheter size: 19 Gauge Catheter at skin depth: 10 cm Test dose: negative  Assessment Events: blood not aspirated, injection not painful, no injection resistance, negative IV test and no paresthesia  Additional Notes Patient identified. Risks/Benefits/Options discussed with patient including but not limited to bleeding, infection, nerve damage, paralysis, failed block, incomplete pain control, headache, blood pressure changes, nausea, vomiting, reactions to medication both or allergic, itching and postpartum back pain. Confirmed with bedside nurse the patient's most recent platelet count. Confirmed with patient that they are not currently taking any anticoagulation, have any bleeding history or any family history of bleeding disorders. Patient expressed understanding and wished to proceed. All questions were answered. Sterile technique was used throughout the entire procedure. Please see nursing notes for vital signs. Test dose was given through epidural catheter and negative prior to continuing to dose epidural or start infusion. Warning signs of high block given to the patient including shortness of breath, tingling/numbness in hands, complete motor block, or any concerning symptoms with instructions to call for help. Patient was  given instructions on fall risk and not to get out of bed. All questions and concerns addressed with instructions to call with any issues or inadequate analgesia.      

## 2015-09-10 LAB — CBC
HEMATOCRIT: 33.7 % — AB (ref 36.0–46.0)
Hemoglobin: 11.5 g/dL — ABNORMAL LOW (ref 12.0–15.0)
MCH: 31.5 pg (ref 26.0–34.0)
MCHC: 34.1 g/dL (ref 30.0–36.0)
MCV: 92.3 fL (ref 78.0–100.0)
PLATELETS: 168 10*3/uL (ref 150–400)
RBC: 3.65 MIL/uL — ABNORMAL LOW (ref 3.87–5.11)
RDW: 13 % (ref 11.5–15.5)
WBC: 14 10*3/uL — AB (ref 4.0–10.5)

## 2015-09-10 LAB — RPR: RPR: NONREACTIVE

## 2015-09-10 MED ORDER — INFLUENZA VAC SPLIT QUAD 0.5 ML IM SUSY
0.5000 mL | PREFILLED_SYRINGE | INTRAMUSCULAR | Status: AC
  Administered 2015-09-10: 0.5 mL via INTRAMUSCULAR
  Filled 2015-09-10: qty 0.5

## 2015-09-10 NOTE — Lactation Note (Signed)
This note was copied from the chart of [place]. Lactation Consultation Note Follow up visit at 29 hours of age.  Mom reports several frequent feedings and baby has been gassy and fussy.  Assisted with latch on right breast in football hold, baby not latching.  Mom has been using NS with previous feedings, assisted with application of #70YF, baby latched quickly with strong sucking.  Stimulation needed to maintain 10 minutes of feeding.  Colostrum noted in NS, baby then latched without for several minutes of brief sucking.  Encouraged mom to continue hand pumping prior to NS application and after for stimulation with NS use.  Encouraged mom to attempt some feedings without NS, but mom aware of o/p services as needed.  Baby asleep with mom STS, discussed cluster feedings.  Mom has not started to use comfort gels yet, but as in room for some previous soreness.    Patient Name: Melinda Smith [id number] Date: 09/10/2015 Reason for consult: Follow-up assessment   Maternal Data Has patient been taught Hand Expression?: Yes  Feeding Feeding Type: Breast Fed Length of feed: 10 min  LATCH Score/Interventions Latch: Repeated attempts needed to sustain latch, nipple held in mouth throughout feeding, stimulation needed to elicit sucking reflex. Intervention(s): Adjust position;Assist with latch;Breast massage;Breast compression  Audible Swallowing: A few with stimulation Intervention(s): Skin to skin;Hand expression Intervention(s): Skin to skin;Hand expression;Alternate breast massage  Type of Nipple: Flat (semi flat short shaft) Intervention(s): Hand pump  Comfort (Breast/Nipple): Soft / non-tender  Problem noted: Mild/Moderate discomfort Interventions (Mild/moderate discomfort): Comfort gels  Hold (Positioning): Assistance needed to correctly position infant at breast and maintain latch. Intervention(s): Breastfeeding basics reviewed;Support Pillows;Position options;Skin to  skin  LATCH Score: 6  Lactation Tools Discussed/Used Tools: Nipple Shields Nipple shield size: 20 Breast pump type: Manual   Consult Status Consult Status: Follow-up Date: 09/11/15 Follow-up type: In-patient    Justice Britain 09/10/2015, 8:54 PM

## 2015-09-10 NOTE — Anesthesia Postprocedure Evaluation (Signed)
  Anesthesia Post-op Note  Patient: Melinda Smith  Procedure(s) Performed: * No procedures listed *  Patient Location: PACU and Mother/Baby  Anesthesia Type:Epidural  Level of Consciousness: awake, alert  and oriented  Airway and Oxygen Therapy: Patient Spontanous Breathing  Post-op Pain: mild  Post-op Assessment: Post-op Vital signs reviewed              Post-op Vital Signs: Reviewed and stable  Last Vitals:  Filed Vitals:   09/10/15 0607  BP: 118/68  Pulse: 82  Temp: 36.8 C  Resp: 18    Complications: No apparent anesthesia complications

## 2015-09-10 NOTE — Lactation Note (Signed)
This note was copied from the chart of Melinda Smith. Lactation Consultation Note Mom stated baby had couple of good BF. Will have occassional painful latches. Noted mom w/short shafts/semi flat. Hand expression demonstrated colostrum. Fitted moms Rt. Nipple #16/20NS, Lt. Nipple #20NS. Gave shells to wear between BF when puts on her supportive bra. Has colostrum. Baby latched well w/o pain. Hand pump given for nipple stimulation. Noted mom has hx: of PCOS. Will report to RN needs DEBP set up.  Mom encouraged to feed baby 8-12 times/24 hours and with feeding cues. educated about newborn behavior, supply and demand. Referred to Baby and Me Book in Breastfeeding section Pg. 22-23 for position options and Proper latch demonstration.[place] brochure given w/resources, support groups and [place] services.  Baby has been very spitty of clear fluid.  Patient Name: [name] Date: 09/10/2015 Reason for consult: Initial assessment   Maternal Data Has patient been taught Hand Expression?: Yes Does the patient have breastfeeding experience prior to this delivery?: No  Feeding Feeding Type: Breast Fed Length of feed: 15 min  LATCH Score/Interventions Latch: Grasps breast easily, tongue down, lips flanged, rhythmical sucking. Intervention(s): Adjust position;Assist with latch;Breast massage;Breast compression  Audible Swallowing: A few with stimulation Intervention(s): Skin to skin;Hand expression Intervention(s): Alternate breast massage  Type of Nipple: Everted at rest and after stimulation (semi flat/short shaft)  Comfort (Breast/Nipple): Soft / non-tender     Hold (Positioning): Assistance needed to correctly position infant at breast and maintain latch. Intervention(s): Breastfeeding basics reviewed;Support Pillows;Position options;Skin to skin  LATCH Score: 8  Lactation Tools Discussed/Used Tools: Shells;Nipple [place] Fuel;Pump Nipple shield size: 16;20 Shell Type:  Inverted Breast pump type: Manual WIC Program: No Pump Review: Setup, frequency, and cleaning;Milk Storage Initiated by:: [name] RN Date initiated:: 09/10/15   Consult Status Consult Status: Follow-up Date: 09/11/15 Follow-up type: In-patient    [name], [name] 09/10/2015, 7:02 AM

## 2015-09-10 NOTE — Progress Notes (Signed)
Patient ID: Melinda Smith, female   DOB: Dec 11, 1981, 34 y.o.   MRN: [id number] PPD # 1 SVD  S:  Reports feeling well             Tolerating po/ No nausea or vomiting             Bleeding is light             Pain controlled with ibuprofen (OTC)             Up ad lib / ambulatory / voiding without difficulties    Newborn  Information for the patient's newborn:  Kaysey, Berndt [[id number]]  female  breast feeding  / Circumcision planning later today   O:  A & O x 3, in no apparent distress              VS:  Filed Vitals:   09/09/15 1715 09/09/15 1815 09/09/15 2313 09/10/15 0607  BP: 106/64 105/57 107/58 118/68  Pulse: 77 84 79 82  Temp: 98.7 F (37.1 C) 98.4 F (36.9 C) 98.4 F (36.9 C) 98.2 F (36.8 C)  TempSrc: Oral Oral Oral Oral  Resp: 18 18 16 18     LABS:  Recent Labs  09/09/15 0657 09/10/15 0610  WBC 10.6* 14.0*  HGB 12.0 11.5*  HCT 35.0* 33.7*  PLT 194 168    Blood type: O POS (09/12 0657)  Rubella: Immune (02/09 0000)   I&O: I/O last 3 completed shifts: In: -  Out: 1093 [Urine:800; Blood:293]             Lungs: Clear and unlabored  Heart: regular rate and rhythm / no murmurs  Abdomen: soft, non-tender, non-distended              Fundus: firm, non-tender, U-1  Perineum: 2nd degree repair healing well  Lochia: minimal  Extremities: No edema, no calf pain or tenderness, No Homans    A/P: PPD # 1  34 y.o., [id number]   Principal Problem:   Postpartum care following vaginal delivery (9/12) Active Problems:   Marginal insertion of umbilical cord   Doing well - stable status  Routine post partum orders  Anticipate discharge tomorrow    Laury Deep, M, MSN, CNM 09/10/2015, 8:50 AM

## 2015-09-11 MED ORDER — IBUPROFEN 600 MG PO TABS: 600.0000 mg | ORAL_TABLET | Freq: Four times a day (QID) | ORAL | Status: DC

## 2015-09-11 NOTE — Discharge Summary (Signed)
Obstetric Discharge Summary Reason for Admission: induction of labor and [redacted] wks gestation, marginal cord insertion Prenatal Procedures: AMA, marginal CI Intrapartum Procedures: spontaneous vaginal delivery and Pitocin, AROM, epidural Postpartum Procedures: Influenza vaccine Complications-Operative and Postpartum: 2nd degree perineal laceration HEMOGLOBIN  Date Value Ref Range Status  09/10/2015 11.5* 12.0 - 15.0 g/dL Final   HCT  Date Value Ref Range Status  09/10/2015 33.7* 36.0 - 46.0 % Final    Physical Exam:  General: alert, cooperative and no distress Lochia: appropriate Uterine Fundus: firm Incision: healing well, no significant drainage, no dehiscence, no significant erythema DVT Evaluation: No evidence of DVT seen on physical exam. Negative Homan's sign. No cords or calf tenderness. No significant calf/ankle edema.  Discharge Diagnoses: Term Pregnancy-delivered  Discharge Information: Date: 09/11/2015 Activity: pelvic rest Diet: routine Medications: PNV and Ibuprofen Condition: stable Instructions: refer to practice specific booklet Discharge to: home Follow-up Information    Follow up with Lovenia Kim, MD. Go in 6 weeks.   Specialty:  Obstetrics and Gynecology   Contact information:   [place] [address] [phone]       Newborn Data: Live born female on 09/09/15 Birth Weight: 8 lb 2.2 oz (3691 g) APGAR: 9, 9  Home with mother.  Dick Hark, [place], N 09/11/2015, 10:00 AM

## 2015-09-11 NOTE — Progress Notes (Signed)
PPD #2- SVD  Subjective:   Reports feeling well, ready for discharge Tolerating po/ No nausea or vomiting Bleeding is light Pain controlled with Motrin Up ad lib / ambulatory / voiding without problems Newborn: breastfeeding  / Circumcision: done   Objective:   VS: VS:  Filed Vitals:   09/09/15 2313 09/10/15 0607 09/10/15 1819 09/11/15 0518  BP: 107/58 118/68 115/50 123/66  Pulse: 79 82 80 75  Temp: 98.4 F (36.9 C) 98.2 F (36.8 C) 98.2 F (36.8 C) 98 F (36.7 C)  TempSrc: Oral Oral Oral Oral  Resp: 16 18 18 18     LABS:  Recent Labs  09/09/15 0657 09/10/15 0610  WBC 10.6* 14.0*  HGB 12.0 11.5*  PLT 194 168   Blood type: --/--/O POS, O POS (09/12 0657) Rubella: Immune (02/09 0000)                I&O: Intake/Output      09/13 0701 - 09/14 0700 09/14 0701 - 09/15 0700   Urine     Blood     Total Output       Net              Physical Exam: Alert and oriented X3 Abdomen: soft, non-tender, non-distended  Fundus: firm, non-tender, U-3 Perineum: Well approximated, no significant erythema, edema, or drainage; healing well. Lochia: small Extremities: No edema, no calf pain or tenderness    Assessment: PPD #2  G3P1021/ S/P:induced vaginal, 2nd degree laceration Doing well - stable for discharge home   Plan: Discharge home RX's:  Ibuprofen 600mg  po Q 6 hrs prn pain #30 Refill x 0 Routine pp visit in 6wks at [place] given    Julianne Handler, N MSN, CNM 09/11/2015, 9:58 AM

## 2015-09-11 NOTE — Lactation Note (Signed)
This note was copied from the chart of [place]. Lactation Consultation Note  Patient Name: Melinda Smith Today's Date: 09/11/2015  Baby 42 hr, WNL for D/C today, see flow sheets. Baby was latched upon entry. Mom reports Bf are going much better and she no long is using the shield. Her L nipple does look bruised on the tip but she reports it is feeling better. Went over breast/nipple care, engorgement prevent/treatment, and had mom demonstrate manual expression. She plans to get Medela PIS from her health insurance and will f/u with o/t apt as needed. She declined making an apt at this time. She will BF on demand, 8+ times in 24 hr while maintaining a good latch at each feeding. She will use comfort gels and ice packs as needed.      Maternal Data    Feeding Feeding Type: Breast Fed Length of feed: 35 min  LATCH Score/Interventions Latch:  (baby latched at entry )  Audible Swallowing: A few with stimulation Intervention(s): Hand expression  Type of Nipple: Everted at rest and after stimulation  Comfort (Breast/Nipple): Filling, red/small blisters or bruises, mild/mod discomfort  Problem noted: Mild/Moderate discomfort (states baby cluster fed last night) Interventions (Mild/moderate discomfort): Comfort gels (OR EBM, coconut or olive oil, do not use lanolin)  Hold (Positioning): No assistance needed to correctly position infant at breast.  LATCH Score: 7  Lactation Tools Discussed/Used Nipple shield size:  (no longer using shield )   Consult Status Consult Status: Complete Date: 09/11/15 Follow-up type:  (will call as needed for OP apt )    Denzil Hughes 09/11/2015, 10:03 AM

## 2015-09-12 ENCOUNTER — Inpatient Hospital Stay (HOSPITAL_COMMUNITY): Admission: AD | Admit: 2015-09-12 | Payer: 59 | Source: Ambulatory Visit | Admitting: Obstetrics and Gynecology

## 2016-01-27 ENCOUNTER — Encounter: Payer: Self-pay | Admitting: Gastroenterology

## 2016-03-19 ENCOUNTER — Ambulatory Visit (INDEPENDENT_AMBULATORY_CARE_PROVIDER_SITE_OTHER): Payer: 59 | Admitting: Gastroenterology

## 2016-03-19 ENCOUNTER — Encounter: Payer: Self-pay | Admitting: Gastroenterology

## 2016-03-19 VITALS — BP 100/62 | HR 80 | Ht 66.14 in | Wt 171.2 lb

## 2016-03-19 DIAGNOSIS — D126 Benign neoplasm of colon, unspecified: Secondary | ICD-10-CM

## 2016-03-19 DIAGNOSIS — Z86010 Personal history of colonic polyps: Secondary | ICD-10-CM

## 2016-03-19 DIAGNOSIS — K625 Hemorrhage of anus and rectum: Secondary | ICD-10-CM | POA: Diagnosis not present

## 2016-03-19 MED ORDER — NA SULFATE-K SULFATE-MG SULF 17.5-3.13-1.6 GM/177ML PO SOLN: 1.0000 | Freq: Once | ORAL | Status: DC

## 2016-03-19 NOTE — Patient Instructions (Signed)

## 2016-03-19 NOTE — Progress Notes (Signed)
HPI :  35 y/o female with chronic constipation, here with complaints of rectal bleeding. She is a former patient of Dr. Olevia Perches, new to me.   Patient reports she was pregnant last year, delivered in September, and since that time she has had intermittent rectal bleeding - red blood in the toilet and in the toilet and in the stools. She has not had bleeding like this before September. She has had constipation during her pregnancy but this has since improved. She thinks she had developed hemorrhoids during the pregnancy. She thinks they prolapse when she has a BM and spontaneously reduce. She does have occasional pain in the perianal area when she passes stools. She is having a BM once to to twice per week which is her baseline. She is not straining currently at all and stools are soft. Previously while pregnant she was straining frequently with her bowel movements. No abdominal pains. No weight loss. Her greatgrandmother had colon cancer. No FH of Crohns known. She has had a prior colonoscopy in 2014 showing a 65mm adenoma, removed from the descending colon.   Colonoscopy 02/2013 - 49mm polyp removed from descending colon, path c/w adenoma  Past Medical History  Diagnosis Date  . Anxiety   . Chronic headaches   . Chronic constipation   . Kidney stone   . Chronic fatigue 2010  . Hemorrhoids   . Tubular adenoma of colon   . PCOS (polycystic ovarian syndrome)   . GERD (gastroesophageal reflux disease)   . 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 Knee arthroscopy      left x 4  . Mandible surgery    . Dilation and curettage of uterus     Family History  Problem Relation Age of Onset  . Diabetes Maternal Grandmother   . Colon cancer      maternal great grandmother  . Alcohol abuse    . Celiac disease Brother   . Diverticulosis Mother   . Multiple sclerosis Mother   . Multiple sclerosis Sister    Social History  Substance Use  Topics  . Smoking status: Never Smoker   . Smokeless tobacco: Never Used  . Alcohol Use: No   Current Outpatient Prescriptions  Medication Sig Dispense Refill  . folic acid (FOLVITE) 1 MG tablet TAKE 4 TABLETS BY MOUTH DAILY.  9  . Prenat w/o A-[id number] (CITRANATAL RX PO) Take by mouth.     No current facility-administered medications for this visit.   Allergies  Allergen Reactions  . Benzoyl Perox-Clindamycin Phos [Clindamycin Phos-Benzoyl Perox] Itching  . Ketek [Telithromycin] Itching and Nausea Only     Review of Systems: All systems reviewed and negative except where noted in HPI.   Lab Results  Component Value Date   WBC 14.0* 09/10/2015   HGB 11.5* 09/10/2015   HCT 33.7* 09/10/2015   MCV 92.3 09/10/2015   PLT 168 09/10/2015    No results found for: ALT, AST, GGT, ALKPHOS, BILITOT  No results found for: CREATININE, BUN, NA, K, CL, CO2   Physical Exam: BP 100/62 mmHg  Pulse 80  Ht 5' 6.14" (1.68 m)  Wt 171 lb 4 oz (77.678 kg)  BMI 27.52 kg/m2 Constitutional: Pleasant,well-developed, female in no acute distress. HEENT: Normocephalic and atraumatic. Conjunctivae are normal. No scleral icterus. Neck supple.  Cardiovascular: Normal rate, regular rhythm.  Pulmonary/chest: Effort normal and breath sounds normal. No wheezing, rales or rhonchi. Abdominal: Soft,  nondistended, nontender. Bowel sounds active throughout. There are no masses palpable. No hepatomegaly. Rectal exam: normal exam, no fissure or hemorrhoids, no mass lesion on DRE Extremities: no edema Lymphadenopathy: No cervical adenopathy noted. Neurological: Alert and oriented to person place and time. Skin: Skin is warm and dry. No rashes noted. Psychiatric: Normal mood and affect. Behavior is normal.   ASSESSMENT AND PLAN: 35 y/o female with history of 27mm adenoma removed in 2014, here with complaints of intermittent rectal bleeding since September. Symptoms sound consistent with hemorrhoidal  bleeding, although no obvious external hemorrhoids or fissure appreciated on DRE today. Given her symptoms, and her history of 18mm adenoma removed 3 years ago, she warrants colonoscopy at this time. The indications, risks, and benefits of colonoscopy were explained to the patient in detail. Risks include but are not limited to bleeding, perforation, adverse reaction to medications, and cardiopulmonary compromise. Sequelae include but are not limited to the possibility of surgery, hospitalization, and mortality. The patient verbalized understanding and wished to proceed. All questions answered, referred to the scheduler and bowel prep ordered. Further recommendations pending results of the exam. If she has internal hemorrhoids as the likely cause of her symptoms, we discussed possible banding procedure and she was interested in proceeding with this if hemorrhoids are the cause.   Milton Cellar, MD [facility] Pager [phone]

## 2016-03-30 ENCOUNTER — Telehealth (HOSPITAL_COMMUNITY): Payer: Self-pay

## 2016-03-30 NOTE — Telephone Encounter (Addendum)
Pt had medication question regarding Suprep (sodium (L2), magnesium(L1) and potassium sulfates) to prep the bowel for colonoscopy. This medication is intended to evacuate the bowel and not be absorbed by the patient. Little if any is transferred into BM according to lactmed.  Propofol (L2) to be administered for sedation. Information given per Walker Kehr and also referred to the [facility] to obtain any additional information they may have.

## 2016-04-01 ENCOUNTER — Ambulatory Visit (AMBULATORY_SURGERY_CENTER): Payer: 59 | Admitting: Gastroenterology

## 2016-04-01 ENCOUNTER — Encounter: Payer: Self-pay | Admitting: Gastroenterology

## 2016-04-01 VITALS — BP 95/59 | HR 67 | Temp 98.4°F | Resp 22 | Ht 66.0 in | Wt 171.0 lb

## 2016-04-01 DIAGNOSIS — K625 Hemorrhage of anus and rectum: Secondary | ICD-10-CM | POA: Diagnosis present

## 2016-04-01 DIAGNOSIS — D127 Benign neoplasm of rectosigmoid junction: Secondary | ICD-10-CM | POA: Diagnosis not present

## 2016-04-01 DIAGNOSIS — D122 Benign neoplasm of ascending colon: Secondary | ICD-10-CM | POA: Diagnosis not present

## 2016-04-01 MED ORDER — SODIUM CHLORIDE 0.9 % IV SOLN: 500.0000 mL | INTRAVENOUS | Status: DC

## 2016-04-01 NOTE — Op Note (Signed)
[facility] Patient Name: Melinda Smith Procedure Date: 04/01/2016 3:02 PM MRN: [id number] Endoscopist: Remo Lipps P. Havery Moros , MD Age: 35 Referring MD:  Date of Birth: 04-Feb-1981 Gender: Female Procedure:                Colonoscopy Indications:              Hematochezia, surveillance for history of 84mm                            adenoma removed 3 years ago Medicines:                Monitored Anesthesia Care Procedure:                Pre-Anesthesia Assessment:                           - Prior to the procedure, a History and Physical                            was performed, and patient medications and                            allergies were reviewed. The patient's tolerance of                            previous anesthesia was also reviewed. The risks                            and benefits of the procedure and the sedation                            options and risks were discussed with the patient.                            All questions were answered, and informed consent                            was obtained. Prior Anticoagulants: The patient has                            taken no previous anticoagulant or antiplatelet                            agents. ASA Grade Assessment: II - A patient with                            mild systemic disease. After reviewing the risks                            and benefits, the patient was deemed in                            satisfactory condition to undergo the procedure.  After obtaining informed consent, the colonoscope                            was passed under direct vision. Throughout the                            procedure, the patient's blood pressure, pulse, and                            oxygen saturations were monitored continuously. The                            Model PCF-H190DL [id number]) scope was introduced                            through the anus and advanced to the the terminal                            ileum, with identification of the appendiceal                            orifice and IC valve. The colonoscopy was performed                            without difficulty. The patient tolerated the                            procedure well. The quality of the bowel                            preparation was adequate. The terminal ileum,                            ileocecal valve, appendiceal orifice, and rectum                            were photographed. Scope In: 3:09:06 PM Scope Out: 3:29:33 PM Scope Withdrawal Time: 0 hours 14 minutes 35 seconds  Total Procedure Duration: 0 hours 20 minutes 27 seconds  Findings:      The perianal and digital rectal examinations were normal.      A 4 mm polyp was found in the ascending colon. The polyp was sessile.       The polyp was removed with a cold snare. Resection and retrieval were       complete.      A 3 mm polyp was found in the recto-sigmoid colon. The polyp was flat.       The polyp was removed with a cold biopsy forceps. Resection and       retrieval were complete.      The terminal ileum appeared normal.      The colon was tortuous.      Non-bleeding internal hemorrhoids were found during retroflexion. The       hemorrhoids were small.      The exam was otherwise without abnormality. Complications:  No immediate complications. Estimated blood loss:                            Minimal. Estimated Blood Loss:     Estimated blood loss was minimal. Impression:               - One 4 mm polyp in the ascending colon, removed                            with a cold snare. Resected and retrieved.                           - One 3 mm polyp at the recto-sigmoid colon,                            removed with a cold biopsy forceps. Resected and                            retrieved.                           - The examined portion of the ileum was normal.                           - Tortuous colon.                            - Non-bleeding internal hemorrhoids, I suspect this                            is the most likely cause of the patient's symptoms                           - The examination was otherwise normal. Recommendation:           - Patient has a contact number available for                            emergencies. The signs and symptoms of potential                            delayed complications were discussed with the                            patient. Return to normal activities Melinda.                            Written discharge instructions were provided to the                            patient.                           - Resume previous diet.                           -  Continue present medications.                           - No aspirin, ibuprofen, naproxen, or other                            non-steroidal anti-inflammatory drugs for 2 weeks                            after polyp removal.                           - Await pathology results.                           - Repeat colonoscopy is recommended for                            surveillance. The colonoscopy date will be                            determined after pathology results from today's                            exam become available for review.                           - Daily fiber supplement to treat hemorrhoids, if                            no improvement follow up for reassessment / banding                            in the GI clinic as needed [place]. Marcial Pless, MD 04/01/2016 3:35:50 PM This report has been signed electronically. Number of Addenda: 0 Referring MD:      Chipper Herb

## 2016-04-01 NOTE — Progress Notes (Signed)
To pacu vss patent aw report to rn 

## 2016-04-01 NOTE — Progress Notes (Signed)
Called to room to assist during endoscopic procedure.  Patient ID and intended procedure confirmed with present staff. Received instructions for my participation in the procedure from the performing physician.  

## 2016-04-01 NOTE — Patient Instructions (Signed)
YOU HAD AN ENDOSCOPIC PROCEDURE TODAY AT [facility]:   Refer to the procedure report that was given to you for any specific questions about what was found during the examination.  If the procedure report does not answer your questions, please call your gastroenterologist to clarify.  If you requested that your care partner not be given the details of your procedure findings, then the procedure report has been included in a sealed envelope for you to review at your convenience later.  YOU SHOULD EXPECT: Some feelings of bloating in the abdomen. Passage of more gas than usual.  Walking can help get rid of the air that was put into your GI tract during the procedure and reduce the bloating. If you had a lower endoscopy (such as a colonoscopy or flexible sigmoidoscopy) you may notice spotting of blood in your stool or on the toilet paper. If you underwent a bowel prep for your procedure, you may not have a normal bowel movement for a few days.  Please Note:  You might notice some irritation and congestion in your nose or some drainage.  This is from the oxygen used during your procedure.  There is no need for concern and it should clear up in a day or so.  SYMPTOMS TO REPORT IMMEDIATELY:   Following lower endoscopy (colonoscopy or flexible sigmoidoscopy):  Excessive amounts of blood in the stool  Significant tenderness or worsening of abdominal pains  Swelling of the abdomen that is new, acute  Fever of 100F or higher    For urgent or emergent issues, a gastroenterologist can be reached at any hour by calling [phone].   DIET: Your first meal following the procedure should be a small meal and then it is ok to progress to your normal diet. Heavy or fried foods are harder to digest and may make you feel nauseous or bloated.  Likewise, meals heavy in dairy and vegetables can increase bloating.  Drink plenty of fluids but you should avoid alcoholic beverages for 24  hours.  ACTIVITY:  You should plan to take it easy for the rest of today and you should NOT DRIVE or use heavy machinery until tomorrow (because of the sedation medicines used during the test).    FOLLOW UP: Our staff will call the number listed on your records the next business day following your procedure to check on you and address any questions or concerns that you may have regarding the information given to you following your procedure. If we do not reach you, we will leave a message.  However, if you are feeling well and you are not experiencing any problems, there is no need to return our call.  We will assume that you have returned to your regular daily activities without incident.  If any biopsies were taken you will be contacted by phone or by letter within the next 1-3 weeks.  Please call us at [phone] if you have not heard about the biopsies in 3 weeks.    SIGNATURES/CONFIDENTIALITY: You and/or your care partner have signed paperwork which will be entered into your electronic medical record.  These signatures attest to the fact that that the information above on your After Visit Summary has been reviewed and is understood.  Full responsibility of the confidentiality of this discharge information lies with you and/or your care-partner.   No aspirin or anti inflammatory products for 2 weeks  Information on polyps and hemorrhoids given to you today

## 2016-04-02 ENCOUNTER — Telehealth: Payer: Self-pay

## 2016-04-02 NOTE — Telephone Encounter (Signed)
  Follow up Call-  Call back number 04/01/2016  Post procedure Call Back phone  # [phone]  Permission to leave phone message Yes     Patient questions:  Do you have a fever, pain , or abdominal swelling? No. Pain Score  0 *  Have you tolerated food without any problems? Yes.    Have you been able to return to your normal activities? Yes.    Do you have any questions about your discharge instructions: Diet   No. Medications  No. Follow up visit  No.  Do you have questions or concerns about your Care? No.  Actions: * If pain score is 4 or above: No action needed, pain <4.

## 2016-04-11 ENCOUNTER — Encounter: Payer: Self-pay | Admitting: Gastroenterology

## 2017-03-16 DIAGNOSIS — J069 Acute upper respiratory infection, unspecified: Secondary | ICD-10-CM | POA: Diagnosis not present

## 2017-03-18 DIAGNOSIS — J0100 Acute maxillary sinusitis, unspecified: Secondary | ICD-10-CM | POA: Diagnosis not present

## 2017-03-18 DIAGNOSIS — R05 Cough: Secondary | ICD-10-CM | POA: Diagnosis not present

## 2017-06-01 DIAGNOSIS — D509 Iron deficiency anemia, unspecified: Secondary | ICD-10-CM | POA: Diagnosis not present

## 2017-06-01 DIAGNOSIS — Z13220 Encounter for screening for lipoid disorders: Secondary | ICD-10-CM | POA: Diagnosis not present

## 2017-06-01 DIAGNOSIS — M79602 Pain in left arm: Secondary | ICD-10-CM | POA: Diagnosis not present

## 2017-06-01 DIAGNOSIS — M79601 Pain in right arm: Secondary | ICD-10-CM | POA: Diagnosis not present

## 2017-06-01 DIAGNOSIS — Z131 Encounter for screening for diabetes mellitus: Secondary | ICD-10-CM | POA: Diagnosis not present

## 2017-11-11 DIAGNOSIS — Z01419 Encounter for gynecological examination (general) (routine) without abnormal findings: Secondary | ICD-10-CM | POA: Diagnosis not present

## 2017-12-15 DIAGNOSIS — D1801 Hemangioma of skin and subcutaneous tissue: Secondary | ICD-10-CM | POA: Diagnosis not present

## 2017-12-15 DIAGNOSIS — D2271 Melanocytic nevi of right lower limb, including hip: Secondary | ICD-10-CM | POA: Diagnosis not present

## 2017-12-15 DIAGNOSIS — L814 Other melanin hyperpigmentation: Secondary | ICD-10-CM | POA: Diagnosis not present

## 2017-12-15 DIAGNOSIS — D485 Neoplasm of uncertain behavior of skin: Secondary | ICD-10-CM | POA: Diagnosis not present

## 2017-12-17 DIAGNOSIS — S81801A Unspecified open wound, right lower leg, initial encounter: Secondary | ICD-10-CM | POA: Diagnosis not present

## 2018-01-12 DIAGNOSIS — J069 Acute upper respiratory infection, unspecified: Secondary | ICD-10-CM | POA: Diagnosis not present

## 2018-01-19 DIAGNOSIS — D225 Melanocytic nevi of trunk: Secondary | ICD-10-CM | POA: Diagnosis not present

## 2018-01-19 DIAGNOSIS — D485 Neoplasm of uncertain behavior of skin: Secondary | ICD-10-CM | POA: Diagnosis not present

## 2018-01-19 DIAGNOSIS — D2271 Melanocytic nevi of right lower limb, including hip: Secondary | ICD-10-CM | POA: Diagnosis not present

## 2018-04-28 DIAGNOSIS — Z3201 Encounter for pregnancy test, result positive: Secondary | ICD-10-CM | POA: Diagnosis not present

## 2018-05-02 DIAGNOSIS — O09291 Supervision of pregnancy with other poor reproductive or obstetric history, first trimester: Secondary | ICD-10-CM | POA: Diagnosis not present

## 2018-05-02 DIAGNOSIS — Z3A01 Less than 8 weeks gestation of pregnancy: Secondary | ICD-10-CM | POA: Diagnosis not present

## 2018-05-04 DIAGNOSIS — O09299 Supervision of pregnancy with other poor reproductive or obstetric history, unspecified trimester: Secondary | ICD-10-CM | POA: Diagnosis not present

## 2018-05-04 DIAGNOSIS — Z3A01 Less than 8 weeks gestation of pregnancy: Secondary | ICD-10-CM | POA: Diagnosis not present

## 2018-06-01 DIAGNOSIS — Z3201 Encounter for pregnancy test, result positive: Secondary | ICD-10-CM | POA: Diagnosis not present

## 2018-06-07 DIAGNOSIS — O09521 Supervision of elderly multigravida, first trimester: Secondary | ICD-10-CM | POA: Diagnosis not present

## 2018-06-07 LAB — OB RESULTS CONSOLE RUBELLA ANTIBODY, IGM: Rubella: IMMUNE

## 2018-06-07 LAB — OB RESULTS CONSOLE HEPATITIS B SURFACE ANTIGEN: HEP B S AG: NEGATIVE

## 2018-06-07 LAB — OB RESULTS CONSOLE ABO/RH: RH Type: POSITIVE

## 2018-06-07 LAB — OB RESULTS CONSOLE GC/CHLAMYDIA
Chlamydia: NEGATIVE
Gonorrhea: NEGATIVE

## 2018-06-07 LAB — OB RESULTS CONSOLE ANTIBODY SCREEN: ANTIBODY SCREEN: NEGATIVE

## 2018-06-07 LAB — OB RESULTS CONSOLE HIV ANTIBODY (ROUTINE TESTING): HIV: NONREACTIVE

## 2018-06-07 LAB — OB RESULTS CONSOLE RPR: RPR: NONREACTIVE

## 2018-06-15 DIAGNOSIS — Z3A11 11 weeks gestation of pregnancy: Secondary | ICD-10-CM | POA: Diagnosis not present

## 2018-06-15 DIAGNOSIS — O09521 Supervision of elderly multigravida, first trimester: Secondary | ICD-10-CM | POA: Diagnosis not present

## 2018-07-05 DIAGNOSIS — L814 Other melanin hyperpigmentation: Secondary | ICD-10-CM | POA: Diagnosis not present

## 2018-07-05 DIAGNOSIS — D1801 Hemangioma of skin and subcutaneous tissue: Secondary | ICD-10-CM | POA: Diagnosis not present

## 2018-07-05 DIAGNOSIS — D225 Melanocytic nevi of trunk: Secondary | ICD-10-CM | POA: Diagnosis not present

## 2018-08-05 DIAGNOSIS — O09522 Supervision of elderly multigravida, second trimester: Secondary | ICD-10-CM | POA: Diagnosis not present

## 2018-08-05 DIAGNOSIS — Z3A18 18 weeks gestation of pregnancy: Secondary | ICD-10-CM | POA: Diagnosis not present

## 2018-10-12 DIAGNOSIS — Z23 Encounter for immunization: Secondary | ICD-10-CM | POA: Diagnosis not present

## 2018-10-12 DIAGNOSIS — O09522 Supervision of elderly multigravida, second trimester: Secondary | ICD-10-CM | POA: Diagnosis not present

## 2018-10-12 DIAGNOSIS — Z3A27 27 weeks gestation of pregnancy: Secondary | ICD-10-CM | POA: Diagnosis not present

## 2018-12-07 DIAGNOSIS — Z3685 Encounter for antenatal screening for Streptococcus B: Secondary | ICD-10-CM | POA: Diagnosis not present

## 2018-12-07 LAB — OB RESULTS CONSOLE GBS: GBS: NEGATIVE

## 2018-12-23 DIAGNOSIS — O3663X0 Maternal care for excessive fetal growth, third trimester, not applicable or unspecified: Secondary | ICD-10-CM | POA: Diagnosis not present

## 2018-12-23 DIAGNOSIS — Z3A38 38 weeks gestation of pregnancy: Secondary | ICD-10-CM | POA: Diagnosis not present

## 2018-12-27 ENCOUNTER — Other Ambulatory Visit: Payer: Self-pay | Admitting: Obstetrics and Gynecology

## 2018-12-28 NOTE — L&D Delivery Note (Signed)
Operative Delivery Note At 12:47 PM a viable and healthy female was delivered via Vaginal, Spontaneous.  Presentation: vertex; Position: Left,, Occiput,, Anterior; Station: +5.  Delivery of the head: 01/05/2019 12:47 PM First maneuver: 01/05/2019 12:47 PM, McRoberts Second maneuver: 01/05/2019 12:47 PM, Suprapubic Pressure Third maneuver: ,   Fourth maneuver: ,   Fifth maneuver: ,   Sixth maneuver: ,    Verbal consent: obtained from patient.  APGAR: 8, 9; weight  .   Placenta status: spontaneous, intaqct.   Cord:  with the following complications: none.  Cord pH: na  Anesthesia:  epidural Episiotomy: None Lacerations: 1st degree Suture Repair: 3.0 vicryl rapide Est. Blood Loss (mL): 136  Mom to postpartum.  Baby to Couplet care / Skin to Skin.  Zekiah Caruth J 01/05/2019, 1:53 PM

## 2018-12-29 ENCOUNTER — Telehealth (HOSPITAL_COMMUNITY): Payer: Self-pay | Admitting: *Deleted

## 2018-12-29 ENCOUNTER — Encounter (HOSPITAL_COMMUNITY): Payer: Self-pay | Admitting: *Deleted

## 2018-12-29 NOTE — Telephone Encounter (Signed)
Preadmission screen  

## 2019-01-05 ENCOUNTER — Inpatient Hospital Stay (HOSPITAL_COMMUNITY): Payer: 59 | Admitting: Anesthesiology

## 2019-01-05 ENCOUNTER — Inpatient Hospital Stay (HOSPITAL_COMMUNITY)
Admission: RE | Admit: 2019-01-05 | Discharge: 2019-01-07 | DRG: 807 | Disposition: A | Discharge status: Home / Self Care | Payer: 59 | Attending: Obstetrics and Gynecology | Admitting: Obstetrics and Gynecology

## 2019-01-05 ENCOUNTER — Encounter (HOSPITAL_COMMUNITY): Payer: Self-pay

## 2019-01-05 DIAGNOSIS — Z3A40 40 weeks gestation of pregnancy: Secondary | ICD-10-CM | POA: Diagnosis not present

## 2019-01-05 DIAGNOSIS — Z3A39 39 weeks gestation of pregnancy: Secondary | ICD-10-CM | POA: Diagnosis not present

## 2019-01-05 DIAGNOSIS — Z3490 Encounter for supervision of normal pregnancy, unspecified, unspecified trimester: Secondary | ICD-10-CM

## 2019-01-05 DIAGNOSIS — O26893 Other specified pregnancy related conditions, third trimester: Secondary | ICD-10-CM | POA: Diagnosis not present

## 2019-01-05 LAB — TYPE AND SCREEN
ABO/RH(D): O POS
Antibody Screen: NEGATIVE

## 2019-01-05 LAB — CBC
HCT: 37.2 % (ref 36.0–46.0)
Hemoglobin: 12.2 g/dL (ref 12.0–15.0)
MCH: 31.7 pg (ref 26.0–34.0)
MCHC: 32.8 g/dL (ref 30.0–36.0)
MCV: 96.6 fL (ref 80.0–100.0)
NRBC: 0 % (ref 0.0–0.2)
Platelets: 176 10*3/uL (ref 150–400)
RBC: 3.85 MIL/uL — ABNORMAL LOW (ref 3.87–5.11)
RDW: 13.4 % (ref 11.5–15.5)
WBC: 10.1 10*3/uL (ref 4.0–10.5)

## 2019-01-05 LAB — RPR: RPR Ser Ql: NONREACTIVE

## 2019-01-05 MED ORDER — TETANUS-DIPHTH-ACELL PERTUSSIS 5-2.5-18.5 LF-MCG/0.5 IM SUSP: 0.5000 mL | Freq: Once | INTRAMUSCULAR | Status: DC

## 2019-01-05 MED ORDER — DIPHENHYDRAMINE HCL 50 MG/ML IJ SOLN: 12.5000 mg | INTRAMUSCULAR | Status: DC | PRN

## 2019-01-05 MED ORDER — PHENYLEPHRINE 40 MCG/ML (10ML) SYRINGE FOR IV PUSH (FOR BLOOD PRESSURE SUPPORT)
80.0000 ug | PREFILLED_SYRINGE | INTRAVENOUS | Status: DC | PRN
  Filled 2019-01-05 (×2): qty 10

## 2019-01-05 MED ORDER — OXYTOCIN 40 UNITS IN NORMAL SALINE INFUSION - SIMPLE MED
1.0000 m[IU]/min | INTRAVENOUS | Status: DC
  Administered 2019-01-05: 4 m[IU]/min via INTRAVENOUS
  Filled 2019-01-05: qty 1000

## 2019-01-05 MED ORDER — OXYCODONE-ACETAMINOPHEN 5-325 MG PO TABS: 2.0000 | ORAL_TABLET | ORAL | Status: DC | PRN

## 2019-01-05 MED ORDER — LIDOCAINE HCL (PF) 1 % IJ SOLN
INTRAMUSCULAR | Status: DC | PRN
  Administered 2019-01-05: 5 mL via EPIDURAL

## 2019-01-05 MED ORDER — OXYCODONE-ACETAMINOPHEN 5-325 MG PO TABS: 1.0000 | ORAL_TABLET | ORAL | Status: DC | PRN

## 2019-01-05 MED ORDER — SENNOSIDES-DOCUSATE SODIUM 8.6-50 MG PO TABS
2.0000 | ORAL_TABLET | ORAL | Status: DC
  Administered 2019-01-05 – 2019-01-06 (×2): 2 via ORAL
  Filled 2019-01-05 (×2): qty 2

## 2019-01-05 MED ORDER — SOD CITRATE-CITRIC ACID 500-334 MG/5ML PO SOLN: 30.0000 mL | ORAL | Status: DC | PRN

## 2019-01-05 MED ORDER — PHENYLEPHRINE 40 MCG/ML (10ML) SYRINGE FOR IV PUSH (FOR BLOOD PRESSURE SUPPORT)
80.0000 ug | PREFILLED_SYRINGE | INTRAVENOUS | Status: DC | PRN
  Filled 2019-01-05: qty 10

## 2019-01-05 MED ORDER — DIBUCAINE 1 % RE OINT: 1.0000 "application " | TOPICAL_OINTMENT | RECTAL | Status: DC | PRN

## 2019-01-05 MED ORDER — ZOLPIDEM TARTRATE 5 MG PO TABS: 5.0000 mg | ORAL_TABLET | Freq: Every evening | ORAL | Status: DC | PRN

## 2019-01-05 MED ORDER — SIMETHICONE 80 MG PO CHEW: 80.0000 mg | CHEWABLE_TABLET | ORAL | Status: DC | PRN

## 2019-01-05 MED ORDER — ONDANSETRON HCL 4 MG/2ML IJ SOLN: 4.0000 mg | INTRAMUSCULAR | Status: DC | PRN

## 2019-01-05 MED ORDER — ERYTHROMYCIN 5 MG/GM OP OINT
TOPICAL_OINTMENT | OPHTHALMIC | Status: AC
  Filled 2019-01-05: qty 1

## 2019-01-05 MED ORDER — FENTANYL 2.5 MCG/ML BUPIVACAINE 1/10 % EPIDURAL INFUSION (WH - ANES)
14.0000 mL/h | INTRAMUSCULAR | Status: DC | PRN
  Administered 2019-01-05: 14 mL/h via EPIDURAL
  Filled 2019-01-05: qty 100

## 2019-01-05 MED ORDER — ACETAMINOPHEN 325 MG PO TABS
650.0000 mg | ORAL_TABLET | ORAL | Status: DC | PRN
  Administered 2019-01-06: 650 mg via ORAL
  Filled 2019-01-05: qty 2

## 2019-01-05 MED ORDER — LACTATED RINGERS IV SOLN
INTRAVENOUS | Status: DC
  Administered 2019-01-05 (×2): via INTRAVENOUS

## 2019-01-05 MED ORDER — BENZOCAINE-MENTHOL 20-0.5 % EX AERO: 1.0000 "application " | INHALATION_SPRAY | CUTANEOUS | Status: DC | PRN

## 2019-01-05 MED ORDER — WITCH HAZEL-GLYCERIN EX PADS: 1.0000 "application " | MEDICATED_PAD | CUTANEOUS | Status: DC | PRN

## 2019-01-05 MED ORDER — OXYTOCIN BOLUS FROM INFUSION: 500.0000 mL | Freq: Once | INTRAVENOUS | Status: DC

## 2019-01-05 MED ORDER — IBUPROFEN 600 MG PO TABS
600.0000 mg | ORAL_TABLET | Freq: Four times a day (QID) | ORAL | Status: DC
  Administered 2019-01-05 – 2019-01-07 (×7): 600 mg via ORAL
  Filled 2019-01-05 (×7): qty 1

## 2019-01-05 MED ORDER — EPHEDRINE 5 MG/ML INJ
10.0000 mg | INTRAVENOUS | Status: DC | PRN
  Filled 2019-01-05: qty 2

## 2019-01-05 MED ORDER — METHYLERGONOVINE MALEATE 0.2 MG PO TABS: 0.2000 mg | ORAL_TABLET | ORAL | Status: DC | PRN

## 2019-01-05 MED ORDER — ONDANSETRON HCL 4 MG PO TABS: 4.0000 mg | ORAL_TABLET | ORAL | Status: DC | PRN

## 2019-01-05 MED ORDER — LACTATED RINGERS IV SOLN: 500.0000 mL | Freq: Once | INTRAVENOUS | Status: DC

## 2019-01-05 MED ORDER — ACETAMINOPHEN 325 MG PO TABS: 650.0000 mg | ORAL_TABLET | ORAL | Status: DC | PRN

## 2019-01-05 MED ORDER — COCONUT OIL OIL
1.0000 "application " | TOPICAL_OIL | Status: DC | PRN
  Administered 2019-01-06: 1 via TOPICAL
  Filled 2019-01-05: qty 120

## 2019-01-05 MED ORDER — LACTATED RINGERS IV SOLN
500.0000 mL | Freq: Once | INTRAVENOUS | Status: AC
  Administered 2019-01-05: 500 mL via INTRAVENOUS

## 2019-01-05 MED ORDER — TERBUTALINE SULFATE 1 MG/ML IJ SOLN
0.2500 mg | Freq: Once | INTRAMUSCULAR | Status: DC | PRN
  Filled 2019-01-05: qty 1

## 2019-01-05 MED ORDER — DIPHENHYDRAMINE HCL 25 MG PO CAPS: 25.0000 mg | ORAL_CAPSULE | Freq: Four times a day (QID) | ORAL | Status: DC | PRN

## 2019-01-05 MED ORDER — ONDANSETRON HCL 4 MG/2ML IJ SOLN: 4.0000 mg | Freq: Four times a day (QID) | INTRAMUSCULAR | Status: DC | PRN

## 2019-01-05 MED ORDER — LACTATED RINGERS IV SOLN: 500.0000 mL | INTRAVENOUS | Status: DC | PRN

## 2019-01-05 MED ORDER — PRENATAL MULTIVITAMIN CH
1.0000 | ORAL_TABLET | Freq: Every day | ORAL | Status: DC
  Administered 2019-01-06 – 2019-01-07 (×2): 1 via ORAL
  Filled 2019-01-05 (×2): qty 1

## 2019-01-05 MED ORDER — LIDOCAINE HCL (PF) 1 % IJ SOLN
30.0000 mL | INTRAMUSCULAR | Status: DC | PRN
  Filled 2019-01-05: qty 30

## 2019-01-05 MED ORDER — OXYTOCIN 40 UNITS IN NORMAL SALINE INFUSION - SIMPLE MED
2.5000 [IU]/h | INTRAVENOUS | Status: DC
  Administered 2019-01-05: 2.5 [IU]/h via INTRAVENOUS

## 2019-01-05 MED ORDER — METHYLERGONOVINE MALEATE 0.2 MG/ML IJ SOLN: 0.2000 mg | INTRAMUSCULAR | Status: DC | PRN

## 2019-01-05 NOTE — Lactation Note (Signed)
This note was copied from a baby's chart. Lactation Consultation Note  Patient Name: Melinda Smith [id number] Date: 01/05/2019 Reason for consult: Initial assessment;Term  P1 mother whose infant is now 60 hours old.  Mother breast fed her first child for 18 months.  Baby was sleeping in mother's arms when I arrived.  Mother stated that she is latching well and she has no questions/concerns at this time.  Encouraged to feed 8-12 times/24 hours or sooner if baby shows feeding cues.  Mother is familiar with cues and hand expression.  Colostrum container provided for any EBM she may obtain with hand expression.  Milk storage times reviewed.  Mom made aware of O/P services, breastfeeding support groups, community resources, and our phone # for post-discharge questions. Mother will return to work in 61 weeks and has a DEBP for home use.  Father present.  Mother will call for assistance as needed.   Maternal Data Formula Feeding for Exclusion: No Has patient been taught Hand Expression?: Yes Does the patient have breastfeeding experience prior to this delivery?: Yes  Feeding    LATCH Score                   Interventions    Lactation Tools Discussed/Used     Consult Status Consult Status: Follow-up Date: 01/06/19 Follow-up type: In-patient    Anjel Pardo R Mylen Mangan 01/05/2019, 8:50 PM

## 2019-01-05 NOTE — Lactation Note (Signed)
This note was copied from a baby's chart. Lactation Consultation Note  Patient Name: Melinda Smith DHDIX'B Date: 01/05/2019    [facility] Initial Visit:  Mother has many visitors and would like a return visit later today.       Caroll Weinheimer R Jaren Vanetten 01/05/2019, 5:09 PM

## 2019-01-05 NOTE — H&P (Signed)
Melinda Smith is a 38 y.o. female presenting for IOL for AMA. OB History    Gravida  5   Para  1   Term  1   Preterm      AB  3   Living  1     SAB  3   TAB      Ectopic      Multiple  0   Live Births  1          Past Medical History:  Diagnosis Date  . Anxiety   . Chronic constipation   . Chronic fatigue 2010  . Chronic headaches   . GERD (gastroesophageal reflux disease)   . Hemorrhoids   . 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 Kidney stone   . PCOS (polycystic ovarian syndrome)   . Tubular adenoma of colon    Past Surgical History:  Procedure Laterality Date  . DILATION AND CURETTAGE OF UTERUS    . KNEE ARTHROSCOPY     left x 4  . MANDIBLE SURGERY     Family History: family history includes Alcohol abuse in an other family member; COPD in her paternal grandmother; Cancer in her father and paternal grandfather; Celiac disease in her brother; Colon cancer in an other family member; Diabetes in her maternal grandmother; Diverticulosis in her mother; Hyperlipidemia in her mother; Multiple sclerosis in her mother and sister. Social History:  reports that she has never smoked. She has never used smokeless tobacco. She reports that she does not drink alcohol or use drugs.     Maternal Diabetes: No Genetic Screening: Normal Maternal Ultrasounds/Referrals: Normal Fetal Ultrasounds or other Referrals:  None Maternal Substance Abuse:  No Significant Maternal Medications:  None Significant Maternal Lab Results:  None Other Comments:  None  Review of Systems  Constitutional: Negative.   All other systems reviewed and are negative.  Maternal Medical History:  Contractions: Onset was less than 1 hour ago.   Perceived severity is mild.    Fetal activity: Perceived fetal activity is normal.   Last perceived fetal movement was within the past hour.    Prenatal complications: no prenatal complications Prenatal Complications - Diabetes: none.       unknown if currently breastfeeding. Maternal Exam:  Uterine Assessment: Contraction strength is mild.  Contraction frequency is rare.   Abdomen: Patient reports no abdominal tenderness. Fetal presentation: vertex  Introitus: Normal vulva. Normal vagina.  Ferning test: not done.  Nitrazine test: not done. Amniotic fluid character: not assessed.  Pelvis: adequate for delivery.   Cervix: Cervix evaluated by digital exam.     Physical Exam  Nursing note and vitals reviewed. Constitutional: She is oriented to person, place, and time. She appears well-developed and well-nourished.  HENT:  Head: Normocephalic and atraumatic.  Neck: Normal range of motion. Neck supple.  Cardiovascular: Normal rate and regular rhythm.  Respiratory: Effort normal and breath sounds normal.  GI: Soft. Bowel sounds are normal.  Genitourinary:    Vulva, vagina and uterus normal.   Musculoskeletal: Normal range of motion.  Neurological: She is alert and oriented to person, place, and time. She has normal reflexes.  Skin: Skin is warm and dry. She is not diaphoretic.  Psychiatric: She has a normal mood and affect.    Prenatal labs: ABO, Rh: O/Positive/-- (06/11 0000) Antibody: Negative (06/11 0000) Rubella: Immune (06/11 0000) RPR: Nonreactive (06/11 0000)  HBsAg: Negative (06/11 0000)  HIV: Non-reactive (06/11 0000)  GBS:  Assessment/Plan: 40 wk IUP AMA IOL   Melinda Smith J 01/05/2019, 7:03 AM

## 2019-01-05 NOTE — Progress Notes (Signed)
MOB was referred for history of depression/anxiety. * Referral screened out by Clinical Social Worker because none of the following criteria appear to apply: ~ History of anxiety/depression during this pregnancy, or of post-partum depression following prior delivery. ~ Diagnosis of anxiety and/or depression within last 3 years OR * MOB's symptoms currently being treated with medication and/or therapy. Please contact the Clinical Social Worker if needs arise, by MOB request, or if MOB scores greater than 9/yes to question 10 on Edinburgh Postpartum Depression Screen.  Coury Grieger Boyd-Gilyard, MSW, LCSW Clinical Social Work [phone]  

## 2019-01-05 NOTE — Anesthesia Preprocedure Evaluation (Signed)
Anesthesia Evaluation  Patient identified by MRN, date of birth, ID band Patient awake    Reviewed: Allergy & Precautions, Patient's Chart, lab work & pertinent test results  Airway Mallampati: II       Dental no notable dental hx.    Pulmonary    Pulmonary exam normal        Cardiovascular Normal cardiovascular exam     Neuro/Psych  Headaches, Anxiety    GI/Hepatic GERD  ,  Endo/Other    Renal/GU      Musculoskeletal   Abdominal   Peds  Hematology   Anesthesia Other Findings   Reproductive/Obstetrics (+) Pregnancy                             Anesthesia Physical Anesthesia Plan  ASA: II  Anesthesia Plan: Epidural   Post-op Pain Management:    Induction:   PONV Risk Score and Plan:   Airway Management Planned: Natural Airway  Additional Equipment: None  Intra-op Plan:   Post-operative Plan:   Informed Consent: I have reviewed the patients History and Physical, chart, labs and discussed the procedure including the risks, benefits and alternatives for the proposed anesthesia with the patient or authorized representative who has indicated his/her understanding and acceptance.     Plan Discussed with:   Anesthesia Plan Comments: (Lab Results      Component                Value               Date                      WBC                      10.1                01/05/2019                HGB                      12.2                01/05/2019                HCT                      37.2                01/05/2019                MCV                      96.6                01/05/2019                PLT                      176                 01/05/2019           )        Anesthesia Quick Evaluation

## 2019-01-05 NOTE — Anesthesia Procedure Notes (Signed)
Epidural Patient location during procedure: OB Start time: 01/05/2019 9:14 AM End time: 01/05/2019 9:20 AM  Staffing Anesthesiologist: Effie Berkshire, MD Performed: anesthesiologist   Preanesthetic Checklist Completed: patient identified, site marked, surgical consent, pre-op evaluation, timeout performed, IV checked, risks and benefits discussed and monitors and equipment checked  Epidural Patient position: sitting Prep: ChloraPrep Patient monitoring: heart rate, continuous pulse ox and blood pressure Approach: midline Location: L3-L4 Injection technique: LOR saline  Needle:  Needle type: Tuohy  Needle gauge: 17 G Needle length: 9 cm Catheter type: closed end flexible Catheter size: 20 Guage Test dose: negative and 1.5% lidocaine  Assessment Events: blood not aspirated, injection not painful, no injection resistance and no paresthesia  Additional Notes LOR @ 4.5  Patient identified. Risks/Benefits/Options discussed with patient including but not limited to bleeding, infection, nerve damage, paralysis, failed block, incomplete pain control, headache, blood pressure changes, nausea, vomiting, reactions to medications, itching and postpartum back pain. Confirmed with bedside nurse the patient's most recent platelet count. Confirmed with patient that they are not currently taking any anticoagulation, have any bleeding history or any family history of bleeding disorders. Patient expressed understanding and wished to proceed. All questions were answered. Sterile technique was used throughout the entire procedure. Please see nursing notes for vital signs. Test dose was given through epidural catheter and negative prior to continuing to dose epidural or start infusion. Warning signs of high block given to the patient including shortness of breath, tingling/numbness in hands, complete motor block, or any concerning symptoms with instructions to call for help. Patient was given instructions on  fall risk and not to get out of bed. All questions and concerns addressed with instructions to call with any issues or inadequate analgesia.    Reason for block:procedure for pain

## 2019-01-05 NOTE — Anesthesia Pain Management Evaluation Note (Signed)
  CRNA Pain Management Visit Note  Patient: Melinda Smith, 38 y.o., female  "Hello I am a member of the anesthesia team at [facility]. We have an anesthesia team available at all times to provide care throughout the hospital, including epidural management and anesthesia for C-section. I don't know your plan for the delivery whether it a natural birth, water birth, IV sedation, nitrous supplementation, doula or epidural, but we want to meet your pain goals."   1.Was your pain managed to your expectations on prior hospitalizations?   Yes   2.What is your expectation for pain management during this hospitalization?     Epidural  3.How can we help you reach that goal? epidural  Record the patient's initial score and the patient's pain goal.   Pain: 0  Pain Goal: 4 [facility] wants you to be able to say your pain was always managed very well.  Melinda Smith 01/05/2019

## 2019-01-06 LAB — CBC
HEMATOCRIT: 33.5 % — AB (ref 36.0–46.0)
Hemoglobin: 11.2 g/dL — ABNORMAL LOW (ref 12.0–15.0)
MCH: 31.5 pg (ref 26.0–34.0)
MCHC: 33.4 g/dL (ref 30.0–36.0)
MCV: 94.4 fL (ref 80.0–100.0)
Platelets: 159 10*3/uL (ref 150–400)
RBC: 3.55 MIL/uL — ABNORMAL LOW (ref 3.87–5.11)
RDW: 13.3 % (ref 11.5–15.5)
WBC: 11.1 10*3/uL — ABNORMAL HIGH (ref 4.0–10.5)
nRBC: 0 % (ref 0.0–0.2)

## 2019-01-06 NOTE — Lactation Note (Signed)
This note was copied from a baby's chart. Lactation Consultation Note; Experienced BF mom reports baby is latching well with no pain. Reports she fed about 30 min ago. Has pacifier- encouraged to put baby to breast whenever she is showing feeding cues. No void yet- lots of stools. No questions at present. Encouraged to call for assist prn.   Patient Name: Melinda Smith [id number] Date: 01/06/2019 Reason for consult: Follow-up assessment   Maternal Data Formula Feeding for Exclusion: No Has patient been taught Hand Expression?: Yes Does the patient have breastfeeding experience prior to this delivery?: Yes  Feeding Feeding Type: Breast Fed  LATCH Score                   Interventions    Lactation Tools Discussed/Used WIC Program: No   Consult Status Consult Status: PRN    Truddie Crumble 01/06/2019, 9:25 AM

## 2019-01-06 NOTE — Anesthesia Postprocedure Evaluation (Signed)
Anesthesia Post Note  Patient: Melinda Smith  Procedure(s) Performed: AN AD HOC LABOR EPIDURAL     Patient location during evaluation: Mother Baby Anesthesia Type: Epidural Level of consciousness: awake and alert, oriented and patient cooperative Pain management: pain level controlled Vital Signs Assessment: post-procedure vital signs reviewed and stable Respiratory status: spontaneous breathing Cardiovascular status: stable Postop Assessment: no headache, epidural receding, patient able to bend at knees and no signs of nausea or vomiting Anesthetic complications: no Comments: Pain score 0.    Last Vitals:  Vitals:   01/05/19 2330 01/06/19 0531  BP: 118/69 104/78  Pulse: 86 75  Resp: 16   Temp: 37 C 36.6 C  SpO2: 98%     Last Pain:  Vitals:   01/06/19 0531  TempSrc: Oral  PainSc: 0-No pain   Pain Goal:                 [facility]

## 2019-01-06 NOTE — Progress Notes (Signed)
Post Partum Day 1 Subjective: no complaints, up ad lib, voiding, tolerating PO and + flatus  Objective: Blood pressure 104/78, pulse 75, temperature 97.9 F (36.6 C), temperature source Oral, resp. rate 16, height 5' 6.25" (1.683 m), weight 91.1 kg, SpO2 98 %, unknown if currently breastfeeding.  Physical Exam:  General: alert, cooperative and appears stated age Lochia: appropriate Uterine Fundus: firm Incision: na DVT Evaluation: No evidence of DVT seen on physical exam. No cords or calf tenderness.  Recent Labs    01/05/19 0709 01/06/19 0551  HGB 12.2 11.2*  HCT 37.2 33.5*    Assessment/Plan: Stable PPD 1 Mild shoulder dystocia- no evidence of fetal injury Plan for discharge tomorrow, Breastfeeding and Lactation consult   LOS: 1 day   Fedor Kazmierski J 01/06/2019, 9:31 AM

## 2019-01-07 MED ORDER — ACETAMINOPHEN 325 MG PO TABS: 650.0000 mg | ORAL_TABLET | ORAL | Status: DC | PRN

## 2019-01-07 MED ORDER — BENZOCAINE-MENTHOL 20-0.5 % EX AERO: 1.0000 "application " | INHALATION_SPRAY | CUTANEOUS | Status: DC | PRN

## 2019-01-07 MED ORDER — COCONUT OIL OIL: 1.0000 "application " | TOPICAL_OIL | 0 refills | Status: DC | PRN

## 2019-01-07 MED ORDER — IBUPROFEN 600 MG PO TABS: 600.0000 mg | ORAL_TABLET | Freq: Four times a day (QID) | ORAL | 0 refills | Status: DC

## 2019-01-07 NOTE — Lactation Note (Signed)
This note was copied from a baby's chart. Lactation Consultation Note  Patient Name: Melinda Smith Today's Date: 01/07/2019   Mom reports that her breasts feel heavier & that her milk will be coming to volume today or tomorrow. Mom does report some nipple shape distortion when infant releases latch. As Mom has likely been using more of a "bulls-eye approach" with latching,  specifics of an asymmetric latch were shown via [employer]. I encouraged Mom to give [place] a call if the change in latch technique does not help in decreasing the misshapening of the nipple. Mom has no other questions or concerns. Mom knows how to reach the lactation office post-discharge.   Matthias Hughs [facility] 01/07/2019, 10:33 AM

## 2019-01-07 NOTE — Discharge Summary (Signed)
OB Discharge Summary  Patient Name: Melinda Smith DOB: 16-Jun-1981 MRN: [id number]  Date of admission: 01/05/2019 Delivering provider: Brien Few   Date of discharge: 01/07/2019  Admitting diagnosis: INDUCTION Intrauterine pregnancy: [redacted]w[redacted]d     Secondary diagnosis:Principal Problem:   Postpartum care following vaginal delivery (1/9) Active Problems:   Term pregnancy   SVD (spontaneous vaginal delivery)  Additional problems:none     Discharge diagnosis:  Patient Active Problem List   Diagnosis Date Noted  . Term pregnancy 01/05/2019  . SVD (spontaneous vaginal delivery) 01/05/2019  . Postpartum care following vaginal delivery (1/9) 01/05/2019  . Colon adenoma 03/19/2016  . ANXIETY 03/06/2008  . CONSTIPATION 03/06/2008  . ALLERGY 03/06/2008                                                                Post partum procedures:none  Augmentation: AROM and Pitocin Pain control: Epidural  Laceration:1st degree  Episiotomy:None  Complications: None  Hospital course:  Induction of Labor With Vaginal Delivery   38 y.o. yo [id number] at [redacted]w[redacted]d was admitted to the hospital 01/05/2019 for induction of labor.  Indication for induction: AMA.  Patient had an uncomplicated labor course as follows: Membrane Rupture Time/Date: 7:55 AM ,01/05/2019   Intrapartum Procedures: Episiotomy: None [1]                                         Lacerations:  1st degree [2]  Patient had delivery of a Viable infant.  Information for the patient's newborn:  Aslin, Farinas Girl Cami [[id number]]  Delivery Method: Vag-Spont   01/05/2019  Details of delivery can be found in separate delivery note.  Patient had a routine postpartum course. Patient is discharged home 01/07/19.  Physical exam  Vitals:   01/06/19 0531 01/06/19 1458 01/06/19 2300 01/07/19 0629  BP: 104/78 125/73 112/69 112/72  Pulse: 75 69 73 76  Resp:  16 18 18   Temp: 97.9 F (36.6 C) 98 F (36.7 C) 97.7 F (36.5 C) 98.2 F (36.8 C)  TempSrc:  Oral Oral Oral Oral  SpO2:  100%  100%  Weight:      Height:       General: alert, cooperative and no distress Lochia: appropriate Uterine Fundus: firm Incision: Healing well with no significant drainage DVT Evaluation: No cords or calf tenderness. No significant calf/ankle edema. Labs: Lab Results  Component Value Date   WBC 11.1 (H) 01/06/2019   HGB 11.2 (L) 01/06/2019   HCT 33.5 (L) 01/06/2019   MCV 94.4 01/06/2019   PLT 159 01/06/2019   No flowsheet data found.  Vaccines: TDaP UTD         Flu    UTD  Discharge instruction: per After Visit Summary and "Baby and Me Booklet".  After Visit Meds:  Allergies as of 01/07/2019      Reactions   Benzoyl Perox-clindamycin Phos [clindamycin Phos-benzoyl Perox] Itching   Ketek [telithromycin] Itching, Nausea Only      Medication List    TAKE these medications   acetaminophen 325 MG tablet Commonly known as:  TYLENOL Take 2 tablets (650 mg total) by mouth every 4 (four) hours as needed (for pain scale <  4).   benzocaine-Menthol 20-0.5 % Aero Commonly known as:  DERMOPLAST Apply 1 application topically as needed for irritation (perineal discomfort).   CITRANATAL RX PO Take 1 tablet by mouth daily.   coconut oil Oil Apply 1 application topically as needed.   folic acid 1 MG tablet Commonly known as:  FOLVITE TAKE 4 TABLETS BY MOUTH DAILY.   ibuprofen 600 MG tablet Commonly known as:  ADVIL,MOTRIN Take 1 tablet (600 mg total) by mouth every 6 (six) hours.            Discharge Care Instructions  (From admission, onward)         Start     Ordered   01/07/19 0000  Discharge wound care:    Comments:  Sitz baths 2 times /day with warm water x 1 week   01/07/19 1127          Diet: routine diet  Activity: Advance as tolerated. Pelvic rest for 6 weeks.   Postpartum contraception: Not Discussed  Newborn Data: Live born female  Birth Weight: 8 lb 10.5 oz (3925 g) APGAR: 51, 9  Newborn Delivery   Birth  date/time:  01/[birth year]/2019 12:47:00 Delivery type:  Vaginal, Spontaneous     named Evie Baby Feeding: Breast Disposition:home with mother   Delivery Report:  Review the Delivery Report for details.    Follow up: Follow-up Information    Brien Few, MD. Schedule an appointment as soon as possible for a visit in 6 week(s).   Specialty:  Obstetrics and Gynecology Contact information: [place] [address] [phone]             Signed: Otilio Carpen, MSN 01/07/2019, 11:28 AM

## 2019-01-16 ENCOUNTER — Ambulatory Visit: Payer: Self-pay

## 2019-01-16 NOTE — Lactation Note (Signed)
This note was copied from a baby's chart. 01/16/2019  Name: Melinda Smith MRN: [id number] Date of Birth: 01/05/2021 Gestational Age: Gestational Age: [redacted]w[redacted]d Birth Weight: 138.5 oz Weight today:    8 pounds 6.6 ounces (3816 grams) with clean newborn diaper  47 day old term Infant presents today with mom for feeding assessment. Mom experiencing a lot of pain with feedings. Mom BF her son for 18 months.   Infant has gained 176 grams in the last 9 days with an average daily weight gain of 20 grams a day. Infant is 4 ounces below birthweight. At [facility] office on Monday, infant weighed 8 pounds 3 ounces which was a week ago.   Mom reports infant feeds every 1-4 hours between feedings. Pediatrician told mom to feed her every 3 hours during the day and every 4 hours at night. Infant self awakens some and mom wakes her up some. Infant usually feeds on one breast per feedings.   Mom is pumping once a day and getting 4-6 ounces. She is storing the milk for return to work and using some for an occasional bottle to rest her nipples. Mom pumps when infant gets a bottle to protect milk supply. Mom has some milk in the freezer.   Infant latched to the right breast in the cradle hold. Infant needed upper and lower lip flanged after latch. Infant clicks throughout feeding. Mom in a lot of pain throughout the feeding, nipple pinched and asymmetrical post latch. # 24 NS applied and infant relatched, mom reports decreased pain with little pain at all with feeding. Infant transferred 2 ounces on the right breast and ml on the left breast.   Discussed the Nipple Shield is meant to be a temporary tool. Discussed importance of pumping to protect milk supply.   Mom reports infant is pretty active at the breast and empties the breast with feeding. Mom with scabs to nipples, mom using EBM, Coconut Oil and Comfort gels to nipples.   Infant with thick labial frenulum that inserts at the bottom of the gum ridge. Upper lip  is tight with flanging with blanching noted with flanging. Upper lip needs flanging on the breast. Infant with some decreased mid tongue elevation. Infant with good tongue extension and lateralization. Nipple is compressed and asymmetrical post feeding. Infant noted to have some tongue thrusting with feeding. Infant with clicking throughout the feeding. Mom was shown oral restrictions and discussed how they can effect milk supply and transfer. Web site information and local provider information given. Reviewed protecting milk supply and watching infant weight over time. Mom to research and speak with dad to determine if they want to have infant evaluated.   Infant to follow up with Pediatrician on Friday 1/24. [facility] has not contacted mom yet. Infant to follow up with Lactation in 1 week.   Mom has good support at home. Mom to call with questions/concerns as needed.   General Information: Mother's reason for visit: Feeding assessment, painful latch Consult: Initial Lactation consultant: Nonah Mattes RN,IBCLC Breastfeeding experience: Bf every 1-4 hours, painful latch with asymmetrical latch Maternal medical conditions: Polycystic ovarian syndrome, Infertility(no fertility treatment for this infant) Maternal medications: Pre-natal vitamin, Other(Folic Acid)  Breastfeeding History: Frequency of breast feeding: every 1-4 hours Duration of feeding: 30 minutes, usually one breast, sometimes 2  Supplementation: Supplement method: bottle(Tommie Tippee)         Breast milk volume: 2-3 ounces Breast milk frequency: 4-5 bottles in a week   Pump type:  Medela pump in style Pump frequency: once a day Pump volume: 4-6 ounces  Infant Output Assessment: Voids per 24 hours: 8 Urine color: Clear yellow Stools per 24 hours: 8 Stool color: Yellow  Breast Assessment: Breast: Compressible Nipple: Erect, Scabs, Cracked Pain level: 10(nipple pain 4-10 with and without latching) Pain  interventions: Bra, Coconut oil, Comfort gels, Nipple shield  Feeding Assessment: Infant oral assessment: Variance Infant oral assessment comment: Infant with thick labial frenulum that inserts at the bottom of the gum ridge. Upper lip is tight with flanging with blanching noted with flanging. Upper lip needs flanging on the breast. Infant with some decreased mid tongue elevation. Infant with good tongue extension and lateralization. Nipple is compressed and asymmetrical post feeding. Infant noted to have some tongue thrusting with feeding. Infant with clicking throughout the feeding. Mom was shown oral restrictions and discussed how they can effect milk supply and transfer. Web site information and local provider information given.  Positioning: Cradle(right breast, 15 minutes) Latch: 2 - Grasps breast easily, tongue down, lips flanged, rhythmical sucking. Audible swallowing: 2 - Spontaneous and intermittent Type of nipple: 2 - Everted at rest and after stimulation Comfort: 0 - Engorged, cracked, bleeding, large blisters, severe discomfort Hold: 2 - No assistance needed to correctly position infant at breast LATCH score: 8 Latch assessment: Deep Lips flanged: No(upper and lower lip needs flanging after latching) Suck assessment: Displays both Tools: Nipple shield 24 mm Pre-feed weight: 3816 grams Post feed weight: 3878 grams Amount transferred: 62 ml Amount supplemented: 0  Additional Feeding Assessment: Infant oral assessment: Variance Infant oral assessment comment: see above Positioning: Cross cradle(left breast, 10 minutes) Latch: 2 - Grasps breast easily, tongue down, lips flanged, rhythmical sucking. Audible swallowing: 2 - Spontaneous and intermittent Type of nipple: 2 - Everted at rest and after stimulation Comfort: 1 - Filling, red/small blisters or bruises, mild/mod discomfort Hold: 2 - No assistance needed to correctly position infant at breast LATCH score: 9 Latch  assessment: Deep Lips flanged: No(upper and lower lip needs flanging) Suck assessment: Displays both Tools: Nipple shield 24 mm Pre-feed weight: 3824 grams post diaper change Post feed weight: 3838 grams Amount transferred: 14 ml Amount supplemented: 0  Totals: Total amount transferred: 76 ml Total supplement given: 0 Total amount pumped post feed: did not pump  1. Offer breast with feeding cues, make sure infant gets at least 8 feedings in 24 hours until she is back at her birthweight 2. Keep infant awake at the breast as needed 3. Massage/compress breast with feedings 4. Used the # 24 Nipple Shield with feeding as needed for pain, try each day to feed without it to see when she is able to feed without it. The goal is to wean off the Nipple Shield when able 5. When using the Nipple Shield it is recommended that you pump 3-4 x a day after breast feeding with your Double electric breast pump for 10-15 minutes to protect milk supply.  6. Offer infant at bottle of pumped milk to rest the nipples as needed or if she is still cueing to feed after breast feeding 7. Infant needs about 71-95 (2.5-3 ounces) for 8 feedings a day or 570-760 ml (19-25 ounces) in 24 hours. Infant may eat more or less depending on how often she feeds. Feed her until she is satisfied.  8. Feed infant using the paced bottle feeding method (video on [email or web address]) 9. Feed infant using the Tommie Tippee extra slow flow nipple 10. Call OB  for All Purpose Nipple Ointment if nipples are not healing in the next 3-5 days  11. Keep up the good work 71. Thank you for allowing me to assist you today 13. Please call with any questions/concerns as needed [phone] 14. Follow up with Lactation in 1 week  [facility] Consultant RN, IBCLC                                                       Donn Pierini 01/16/2019, 3:46 PM

## 2019-05-09 ENCOUNTER — Encounter: Payer: Self-pay | Admitting: Gastroenterology

## 2021-02-06 ENCOUNTER — Encounter: Payer: Self-pay | Admitting: Gastroenterology

## 2021-03-17 DIAGNOSIS — E282 Polycystic ovarian syndrome: Secondary | ICD-10-CM | POA: Insufficient documentation

## 2021-03-18 ENCOUNTER — Ambulatory Visit (AMBULATORY_SURGERY_CENTER): Payer: Self-pay | Admitting: *Deleted

## 2021-03-18 ENCOUNTER — Encounter: Payer: Self-pay | Admitting: Gastroenterology

## 2021-03-18 ENCOUNTER — Other Ambulatory Visit: Payer: Self-pay

## 2021-03-18 VITALS — Ht 66.25 in | Wt 190.0 lb

## 2021-03-18 DIAGNOSIS — Z86010 Personal history of colonic polyps: Secondary | ICD-10-CM

## 2021-03-18 MED ORDER — PLENVU 140 G PO SOLR: 1.0000 | Freq: Once | ORAL | 0 refills | Status: AC

## 2021-03-18 NOTE — Progress Notes (Signed)
No egg or soy allergy known to patient  No issues with past sedation with any surgeries or procedures Patient denies ever being told they had issues or difficulty with intubation  No FH of Malignant Hyperthermia No diet pills per patient No home 02 use per patient  No blood thinners per patient  Pt denies issues with constipation  No A fib or A flutter  EMMI video to pt or via [facility] 19 guidelines implemented in PV today with Pt and RN  Pt is fully vaccinated  for Covid    Coupon given to pt in PV today , Code to Pharmacy and  NO PA's for preps discussed with pt In PV today  Discussed with pt there will be an out-of-pocket cost for prep and that varies from $0 to 70 dollars   Resent new instructions for Plenvu, originally had suprep ,  New instructions mailed.  Due to the COVID-19 pandemic we are asking patients to follow certain guidelines.  Pt aware of COVID protocols and [facility] guidelines

## 2021-03-25 ENCOUNTER — Telehealth: Payer: Self-pay | Admitting: Gastroenterology

## 2021-03-25 DIAGNOSIS — Z86010 Personal history of colonic polyps: Secondary | ICD-10-CM

## 2021-03-25 MED ORDER — SUTAB 1479-225-188 MG PO TABS: 1.0000 | ORAL_TABLET | ORAL | 0 refills | Status: DC

## 2021-03-25 NOTE — Telephone Encounter (Signed)
I spoke with the patient, notified her about the Plenvu back order. Changed to sutab pt agreeable to take this. New Sutab Rx with coupon attached sent to her pharmacy and new prep instructions sent to pt via her email-pt is aware. She will call us with any questions.

## 2021-03-25 NOTE — Telephone Encounter (Signed)
Patient called states [facility] called her to advise there is an issue with the Plenvu.

## 2021-03-27 DIAGNOSIS — N920 Excessive and frequent menstruation with regular cycle: Secondary | ICD-10-CM | POA: Insufficient documentation

## 2021-04-01 ENCOUNTER — Encounter: Payer: Self-pay | Admitting: Gastroenterology

## 2021-04-01 ENCOUNTER — Other Ambulatory Visit: Payer: Self-pay

## 2021-04-01 ENCOUNTER — Ambulatory Visit (AMBULATORY_SURGERY_CENTER): Payer: 59 | Admitting: Gastroenterology

## 2021-04-01 VITALS — BP 112/61 | HR 70 | Temp 98.4°F | Resp 12 | Ht 66.25 in | Wt 190.0 lb

## 2021-04-01 DIAGNOSIS — Z86010 Personal history of colonic polyps: Secondary | ICD-10-CM | POA: Diagnosis present

## 2021-04-01 DIAGNOSIS — D123 Benign neoplasm of transverse colon: Secondary | ICD-10-CM | POA: Diagnosis not present

## 2021-04-01 MED ORDER — SODIUM CHLORIDE 0.9 % IV SOLN: 500.0000 mL | Freq: Once | INTRAVENOUS | Status: DC

## 2021-04-01 NOTE — Op Note (Signed)
[place] Patient Name: Melinda Smith Procedure Date: 04/01/2021 9:15 AM MRN: [id number] Endoscopist: Remo Lipps P. Havery Moros , MD Age: 40 Referring MD:  Date of Birth: 02-10-1981 Gender: Female Account #: [id number] Procedure:                Colonoscopy Indications:              High risk colon cancer surveillance: Personal                            history of colonic polyps (2014 - advanced adenoma                            - age 72, 2017 - 2 small adenomas) Medicines:                Monitored Anesthesia Care Procedure:                Pre-Anesthesia Assessment:                           - Prior to the procedure, a History and Physical                            was performed, and patient medications and                            allergies were reviewed. The patient's tolerance of                            previous anesthesia was also reviewed. The risks                            and benefits of the procedure and the sedation                            options and risks were discussed with the patient.                            All questions were answered, and informed consent                            was obtained. Prior Anticoagulants: The patient has                            taken no previous anticoagulant or antiplatelet                            agents. ASA Grade Assessment: II - A patient with                            mild systemic disease. After reviewing the risks                            and benefits, the patient was deemed in  satisfactory condition to undergo the procedure.                           After obtaining informed consent, the colonoscope                            was passed under direct vision. Throughout the                            procedure, the patient's blood pressure, pulse, and                            oxygen saturations were monitored continuously. The                            Olympus PCF-H190DL  ([id number]) Colonoscope was                            introduced through the anus and advanced to the the                            cecum, identified by appendiceal orifice and                            ileocecal valve. The colonoscopy was performed                            without difficulty. The patient tolerated the                            procedure well. The quality of the bowel                            preparation was good. The ileocecal valve,                            appendiceal orifice, and rectum were photographed. Scope In: 9:21:53 AM Scope Out: 9:43:52 AM Scope Withdrawal Time: 0 hours 16 minutes 46 seconds  Total Procedure Duration: 0 hours 21 minutes 59 seconds  Findings:                 The perianal and digital rectal examinations were                            normal.                           Three sessile polyps were found in the transverse                            colon. The polyps were 2 to 4 mm in size. These                            polyps were removed with a cold snare. Resection  and retrieval were complete.                           A few medium-mouthed diverticula were found in the                            sigmoid colon and ascending colon.                           Internal hemorrhoids were found during                            retroflexion. The hemorrhoids were small.                           The exam was otherwise without abnormality. Complications:            No immediate complications. Estimated blood loss:                            Minimal. Estimated Blood Loss:     Estimated blood loss was minimal. Impression:               - Three 2 to 4 mm polyps in the transverse colon,                            removed with a cold snare. Resected and retrieved.                           - Diverticulosis in the sigmoid colon and in the                            ascending colon.                           - Internal  hemorrhoids.                           - The examination was otherwise normal. Recommendation:           - Patient has a contact number available for                            emergencies. The signs and symptoms of potential                            delayed complications were discussed with the                            patient. Return to normal activities tomorrow.                            Written discharge instructions were provided to the                            patient.                           -  Resume previous diet.                           - Continue present medications.                           - Await pathology results. Remo Lipps P. Abby Stines, MD 04/01/2021 9:47:52 AM This report has been signed electronically.

## 2021-04-01 NOTE — Patient Instructions (Signed)
Handouts Provided:  Polyps and Diverticulosis ° °YOU HAD AN ENDOSCOPIC PROCEDURE TODAY AT THE [facility]:   Refer to the procedure report that was given to you for any specific questions about what was found during the examination.  If the procedure report does not answer your questions, please call your gastroenterologist to clarify.  If you requested that your care partner not be given the details of your procedure findings, then the procedure report has been included in a sealed envelope for you to review at your convenience later. ° °YOU SHOULD EXPECT: Some feelings of bloating in the abdomen. Passage of more gas than usual.  Walking can help get rid of the air that was put into your GI tract during the procedure and reduce the bloating. If you had a lower endoscopy (such as a colonoscopy or flexible sigmoidoscopy) you may notice spotting of blood in your stool or on the toilet paper. If you underwent a bowel prep for your procedure, you may not have a normal bowel movement for a few days. ° °Please Note:  You might notice some irritation and congestion in your nose or some drainage.  This is from the oxygen used during your procedure.  There is no need for concern and it should clear up in a day or so. ° °SYMPTOMS TO REPORT IMMEDIATELY: ° °Following lower endoscopy (colonoscopy or flexible sigmoidoscopy): ° Excessive amounts of blood in the stool ° Significant tenderness or worsening of abdominal pains ° Swelling of the abdomen that is new, acute ° Fever of 100°F or higher ° °For urgent or emergent issues, a gastroenterologist can be reached at any hour by calling [phone]. °Do not use MyChart messaging for urgent concerns.  ° ° °DIET:  We do recommend a small meal at first, but then you may proceed to your regular diet.  Drink plenty of fluids but you should avoid alcoholic beverages for 24 hours. ° °ACTIVITY:  You should plan to take it easy for the rest of today and you should NOT DRIVE  or use heavy machinery until tomorrow (because of the sedation medicines used during the test).   ° °FOLLOW UP: °Our staff will call the number listed on your records 48-72 hours following your procedure to check on you and address any questions or concerns that you may have regarding the information given to you following your procedure. If we do not reach you, we will leave a message.  We will attempt to reach you two times.  During this call, we will ask if you have developed any symptoms of COVID 19. If you develop any symptoms (ie: fever, flu-like symptoms, shortness of breath, cough etc.) before then, please call [phone].  If you test positive for Covid 19 in the 2 weeks post procedure, please call and report this information to us.   ° °If any biopsies were taken you will be contacted by phone or by letter within the next 1-3 weeks.  Please call us at [phone] if you have not heard about the biopsies in 3 weeks.  ° ° °SIGNATURES/CONFIDENTIALITY: °You and/or your care partner have signed paperwork which will be entered into your electronic medical record.  These signatures attest to the fact that that the information above on your After Visit Summary has been reviewed and is understood.  Full responsibility of the confidentiality of this discharge information lies with you and/or your care-partner. ° °

## 2021-04-01 NOTE — Progress Notes (Signed)
pt tolerated well. VSS. awake and to recovery. Report given to RN.  

## 2021-04-01 NOTE — Progress Notes (Signed)
Vs by CW in adm  Pt's states no medical or surgical changes since previsit or office visit.     

## 2021-04-01 NOTE — Progress Notes (Signed)
Called to room to assist during endoscopic procedure.  Patient ID and intended procedure confirmed with present staff. Received instructions for my participation in the procedure from the performing physician.  

## 2021-04-03 ENCOUNTER — Telehealth: Payer: Self-pay

## 2021-04-03 ENCOUNTER — Telehealth: Payer: Self-pay | Admitting: *Deleted

## 2021-04-03 NOTE — Telephone Encounter (Signed)
  Follow up Call-  Call back number 04/01/2021  Post procedure Call Back phone  # [phone]  Permission to leave phone message Yes  Some recent data might be hidden     Patient questions:  Do you have a fever, pain , or abdominal swelling? No. Pain Score  0 *  Have you tolerated food without any problems? Yes.    Have you been able to return to your normal activities? Yes.    Do you have any questions about your discharge instructions: Diet   No. Medications  No. Follow up visit  No.  Do you have questions or concerns about your Care? No.  Actions: * If pain score is 4 or above: No action needed, pain <4.  1. Have you developed a fever since your procedure? no  2.   Have you had an respiratory symptoms (SOB or cough) since your procedure? no  3.   Have you tested positive for COVID 19 since your procedure no  4.   Have you had any family members/close contacts diagnosed with the COVID 19 since your procedure?  no   If yes to any of these questions please route to Joylene John, RN and Joella Prince, RN

## 2021-04-03 NOTE — Telephone Encounter (Signed)
Attempted f/u call. Unable to leave VM busy signal.

## 2021-07-30 LAB — OB RESULTS CONSOLE ABO/RH: RH Type: POSITIVE

## 2021-07-30 LAB — OB RESULTS CONSOLE ANTIBODY SCREEN: Antibody Screen: NEGATIVE

## 2021-09-02 DIAGNOSIS — O09529 Supervision of elderly multigravida, unspecified trimester: Secondary | ICD-10-CM | POA: Insufficient documentation

## 2021-09-10 LAB — OB RESULTS CONSOLE HIV ANTIBODY (ROUTINE TESTING): HIV: NONREACTIVE

## 2021-09-10 LAB — OB RESULTS CONSOLE HEPATITIS B SURFACE ANTIGEN: Hepatitis B Surface Ag: NEGATIVE

## 2021-09-10 LAB — OB RESULTS CONSOLE RPR: RPR: NONREACTIVE

## 2021-09-10 LAB — OB RESULTS CONSOLE RUBELLA ANTIBODY, IGM: Rubella: IMMUNE

## 2021-09-23 LAB — OB RESULTS CONSOLE GC/CHLAMYDIA
Chlamydia: NEGATIVE
Gonorrhea: NEGATIVE

## 2021-12-28 NOTE — L&D Delivery Note (Signed)
Delivery Note ?At 5:16 PM a viable and healthy female was delivered via Vaginal, Spontaneous (Presentation: Left Occiput Anterior).  APGAR: 9, 9; weight pending .   ?Placenta status: spontaneous ,intact  .  Cord: 3 vessels with the following complications: None.  Cord pH: na ? ?Anesthesia: Epidural ?Episiotomy: None ?Lacerations:  second ?Suture Repair: 2.0 vicryl rapide ?Est. Blood Loss (mL):  400 ? ?Mom to postpartum.  Baby to Couplet care / Skin to Skin. ? ?Melinda Smith J ?04/01/2022, 5:32 PM ? ? ? ?

## 2022-01-21 DIAGNOSIS — O24419 Gestational diabetes mellitus in pregnancy, unspecified control: Secondary | ICD-10-CM | POA: Diagnosis present

## 2022-02-11 ENCOUNTER — Other Ambulatory Visit: Payer: Self-pay

## 2022-02-11 ENCOUNTER — Encounter: Payer: 59 | Attending: Obstetrics and Gynecology | Admitting: Registered"

## 2022-02-11 DIAGNOSIS — O24419 Gestational diabetes mellitus in pregnancy, unspecified control: Secondary | ICD-10-CM | POA: Diagnosis not present

## 2022-02-12 ENCOUNTER — Encounter: Payer: Self-pay | Admitting: Registered"

## 2022-02-12 NOTE — Progress Notes (Signed)
Patient was seen on 02/11/22 for Gestational Diabetes self-management class at the [facility]. The following learning objectives were met by the patient during this course:  States the definition of Gestational Diabetes States why dietary management is important in controlling blood glucose Describes the effects each nutrient has on blood glucose levels Demonstrates ability to create a balanced meal plan Demonstrates carbohydrate counting  States when to check blood glucose levels Demonstrates proper blood glucose monitoring techniques States the effect of stress and exercise on blood glucose levels States the importance of limiting caffeine and abstaining from alcohol and smoking  Blood glucose monitor given: none, patient has meter.  Patient instructed to monitor glucose levels: FBS: 60 - <95; 1 hour: <140; 2 hour: <120  Patient received handouts: Nutrition Diabetes and Pregnancy, including carb counting list  Patient will be seen for follow-up as needed.

## 2022-03-05 LAB — OB RESULTS CONSOLE GBS: GBS: NEGATIVE

## 2022-03-07 DIAGNOSIS — R112 Nausea with vomiting, unspecified: Secondary | ICD-10-CM | POA: Insufficient documentation

## 2022-03-27 ENCOUNTER — Other Ambulatory Visit: Payer: Self-pay | Admitting: Obstetrics and Gynecology

## 2022-03-27 ENCOUNTER — Encounter (HOSPITAL_COMMUNITY): Payer: Self-pay | Admitting: *Deleted

## 2022-03-27 ENCOUNTER — Telehealth (HOSPITAL_COMMUNITY): Payer: Self-pay | Admitting: *Deleted

## 2022-03-27 NOTE — Telephone Encounter (Signed)
Preadmission screen  

## 2022-04-01 ENCOUNTER — Inpatient Hospital Stay (HOSPITAL_COMMUNITY): Payer: 59 | Admitting: Anesthesiology

## 2022-04-01 ENCOUNTER — Other Ambulatory Visit: Payer: Self-pay

## 2022-04-01 ENCOUNTER — Encounter (HOSPITAL_COMMUNITY): Payer: Self-pay | Admitting: Obstetrics and Gynecology

## 2022-04-01 ENCOUNTER — Inpatient Hospital Stay (HOSPITAL_COMMUNITY)
Admission: AD | Admit: 2022-04-01 | Discharge: 2022-04-03 | DRG: 807 | Disposition: A | Discharge status: Home / Self Care | Payer: 59 | Attending: Obstetrics and Gynecology | Admitting: Obstetrics and Gynecology

## 2022-04-01 ENCOUNTER — Inpatient Hospital Stay (HOSPITAL_COMMUNITY): Admission: RE | Admit: 2022-04-01 | Payer: 59 | Source: Ambulatory Visit

## 2022-04-01 DIAGNOSIS — O9902 Anemia complicating childbirth: Secondary | ICD-10-CM | POA: Diagnosis present

## 2022-04-01 DIAGNOSIS — D509 Iron deficiency anemia, unspecified: Secondary | ICD-10-CM | POA: Diagnosis present

## 2022-04-01 DIAGNOSIS — O24419 Gestational diabetes mellitus in pregnancy, unspecified control: Secondary | ICD-10-CM | POA: Diagnosis present

## 2022-04-01 DIAGNOSIS — Z3A39 39 weeks gestation of pregnancy: Secondary | ICD-10-CM

## 2022-04-01 DIAGNOSIS — O24420 Gestational diabetes mellitus in childbirth, diet controlled: Principal | ICD-10-CM | POA: Diagnosis present

## 2022-04-01 DIAGNOSIS — Z3490 Encounter for supervision of normal pregnancy, unspecified, unspecified trimester: Secondary | ICD-10-CM | POA: Diagnosis present

## 2022-04-01 LAB — CBC
HCT: 32.4 % — ABNORMAL LOW (ref 36.0–46.0)
Hemoglobin: 10.5 g/dL — ABNORMAL LOW (ref 12.0–15.0)
MCH: 28.5 pg (ref 26.0–34.0)
MCHC: 32.4 g/dL (ref 30.0–36.0)
MCV: 87.8 fL (ref 80.0–100.0)
Platelets: 224 10*3/uL (ref 150–400)
RBC: 3.69 MIL/uL — ABNORMAL LOW (ref 3.87–5.11)
RDW: 12.7 % (ref 11.5–15.5)
WBC: 9.8 10*3/uL (ref 4.0–10.5)
nRBC: 0 % (ref 0.0–0.2)

## 2022-04-01 LAB — GLUCOSE, CAPILLARY
Glucose-Capillary: 63 mg/dL — ABNORMAL LOW (ref 70–99)
Glucose-Capillary: 91 mg/dL (ref 70–99)

## 2022-04-01 LAB — TYPE AND SCREEN
ABO/RH(D): O POS
Antibody Screen: NEGATIVE

## 2022-04-01 LAB — RPR: RPR Ser Ql: NONREACTIVE

## 2022-04-01 MED ORDER — LACTATED RINGERS IV SOLN: 500.0000 mL | Freq: Once | INTRAVENOUS | Status: DC

## 2022-04-01 MED ORDER — ONDANSETRON HCL 4 MG/2ML IJ SOLN: 4.0000 mg | INTRAMUSCULAR | Status: DC | PRN

## 2022-04-01 MED ORDER — FENTANYL-BUPIVACAINE-NACL 0.5-0.125-0.9 MG/250ML-% EP SOLN
12.0000 mL/h | EPIDURAL | Status: DC | PRN
  Administered 2022-04-01: 12 mL/h via EPIDURAL
  Filled 2022-04-01: qty 250

## 2022-04-01 MED ORDER — ONDANSETRON HCL 4 MG/2ML IJ SOLN: 4.0000 mg | Freq: Four times a day (QID) | INTRAMUSCULAR | Status: DC | PRN

## 2022-04-01 MED ORDER — OXYCODONE-ACETAMINOPHEN 5-325 MG PO TABS: 2.0000 | ORAL_TABLET | ORAL | Status: DC | PRN

## 2022-04-01 MED ORDER — ACETAMINOPHEN 325 MG PO TABS: 650.0000 mg | ORAL_TABLET | ORAL | Status: DC | PRN

## 2022-04-01 MED ORDER — BENZOCAINE-MENTHOL 20-0.5 % EX AERO
1.0000 "application " | INHALATION_SPRAY | CUTANEOUS | Status: DC | PRN
  Administered 2022-04-02: 1 via TOPICAL
  Filled 2022-04-01: qty 56

## 2022-04-01 MED ORDER — EPHEDRINE 5 MG/ML INJ: 10.0000 mg | INTRAVENOUS | Status: DC | PRN

## 2022-04-01 MED ORDER — WITCH HAZEL-GLYCERIN EX PADS: 1.0000 "application " | MEDICATED_PAD | CUTANEOUS | Status: DC | PRN

## 2022-04-01 MED ORDER — PHENYLEPHRINE 40 MCG/ML (10ML) SYRINGE FOR IV PUSH (FOR BLOOD PRESSURE SUPPORT): 80.0000 ug | PREFILLED_SYRINGE | INTRAVENOUS | Status: DC | PRN

## 2022-04-01 MED ORDER — SIMETHICONE 80 MG PO CHEW: 80.0000 mg | CHEWABLE_TABLET | ORAL | Status: DC | PRN

## 2022-04-01 MED ORDER — LACTATED RINGERS IV SOLN: INTRAVENOUS | Status: DC

## 2022-04-01 MED ORDER — METHYLERGONOVINE MALEATE 0.2 MG/ML IJ SOLN: 0.2000 mg | INTRAMUSCULAR | Status: DC | PRN

## 2022-04-01 MED ORDER — DIPHENHYDRAMINE HCL 50 MG/ML IJ SOLN: 12.5000 mg | INTRAMUSCULAR | Status: DC | PRN

## 2022-04-01 MED ORDER — ACETAMINOPHEN 325 MG PO TABS
650.0000 mg | ORAL_TABLET | ORAL | Status: DC | PRN
  Administered 2022-04-01 – 2022-04-02 (×4): 650 mg via ORAL
  Filled 2022-04-01 (×4): qty 2

## 2022-04-01 MED ORDER — LIDOCAINE HCL (PF) 1 % IJ SOLN
INTRAMUSCULAR | Status: DC | PRN
  Administered 2022-04-01: 10 mL via EPIDURAL

## 2022-04-01 MED ORDER — OXYTOCIN-SODIUM CHLORIDE 30-0.9 UT/500ML-% IV SOLN
1.0000 m[IU]/min | INTRAVENOUS | Status: DC
  Administered 2022-04-01: 2 m[IU]/min via INTRAVENOUS

## 2022-04-01 MED ORDER — LIDOCAINE HCL (PF) 1 % IJ SOLN
30.0000 mL | INTRAMUSCULAR | Status: DC | PRN
Start: 2022-04-01 — End: 2022-04-01

## 2022-04-01 MED ORDER — SOD CITRATE-CITRIC ACID 500-334 MG/5ML PO SOLN: 30.0000 mL | ORAL | Status: DC | PRN

## 2022-04-01 MED ORDER — TERBUTALINE SULFATE 1 MG/ML IJ SOLN: 0.2500 mg | Freq: Once | INTRAMUSCULAR | Status: DC | PRN

## 2022-04-01 MED ORDER — ONDANSETRON HCL 4 MG PO TABS: 4.0000 mg | ORAL_TABLET | ORAL | Status: DC | PRN

## 2022-04-01 MED ORDER — OXYTOCIN-SODIUM CHLORIDE 30-0.9 UT/500ML-% IV SOLN
2.5000 [IU]/h | INTRAVENOUS | Status: DC
  Filled 2022-04-01: qty 500

## 2022-04-01 MED ORDER — DIBUCAINE (PERIANAL) 1 % EX OINT: 1.0000 "application " | TOPICAL_OINTMENT | CUTANEOUS | Status: DC | PRN

## 2022-04-01 MED ORDER — TETANUS-DIPHTH-ACELL PERTUSSIS 5-2.5-18.5 LF-MCG/0.5 IM SUSY: 0.5000 mL | PREFILLED_SYRINGE | Freq: Once | INTRAMUSCULAR | Status: DC

## 2022-04-01 MED ORDER — OXYTOCIN BOLUS FROM INFUSION
333.0000 mL | Freq: Once | INTRAVENOUS | Status: AC
  Administered 2022-04-01: 333 mL via INTRAVENOUS

## 2022-04-01 MED ORDER — IBUPROFEN 600 MG PO TABS
600.0000 mg | ORAL_TABLET | Freq: Four times a day (QID) | ORAL | Status: DC
  Administered 2022-04-01 – 2022-04-03 (×7): 600 mg via ORAL
  Filled 2022-04-01 (×7): qty 1

## 2022-04-01 MED ORDER — DIPHENHYDRAMINE HCL 25 MG PO CAPS: 25.0000 mg | ORAL_CAPSULE | Freq: Four times a day (QID) | ORAL | Status: DC | PRN

## 2022-04-01 MED ORDER — SENNOSIDES-DOCUSATE SODIUM 8.6-50 MG PO TABS
2.0000 | ORAL_TABLET | Freq: Every day | ORAL | Status: DC
  Administered 2022-04-02 – 2022-04-03 (×2): 2 via ORAL
  Filled 2022-04-01 (×2): qty 2

## 2022-04-01 MED ORDER — ZOLPIDEM TARTRATE 5 MG PO TABS: 5.0000 mg | ORAL_TABLET | Freq: Every evening | ORAL | Status: DC | PRN

## 2022-04-01 MED ORDER — LACTATED RINGERS IV SOLN: 500.0000 mL | INTRAVENOUS | Status: DC | PRN

## 2022-04-01 MED ORDER — COCONUT OIL OIL
1.0000 "application " | TOPICAL_OIL | Status: DC | PRN
  Administered 2022-04-02: 1 via TOPICAL

## 2022-04-01 MED ORDER — METHYLERGONOVINE MALEATE 0.2 MG PO TABS: 0.2000 mg | ORAL_TABLET | ORAL | Status: DC | PRN

## 2022-04-01 MED ORDER — OXYCODONE-ACETAMINOPHEN 5-325 MG PO TABS: 1.0000 | ORAL_TABLET | ORAL | Status: DC | PRN

## 2022-04-01 MED ORDER — PRENATAL MULTIVITAMIN CH
1.0000 | ORAL_TABLET | Freq: Every day | ORAL | Status: DC
  Administered 2022-04-02 – 2022-04-03 (×2): 1 via ORAL
  Filled 2022-04-01 (×2): qty 1

## 2022-04-01 NOTE — Anesthesia Preprocedure Evaluation (Signed)
Anesthesia Evaluation  ?Patient identified by MRN, date of birth, ID band ?Patient awake ? ? ? ?Reviewed: ?Allergy & Precautions, H&P , Patient's Chart, lab work & pertinent test results ? ?Airway ?Mallampati: II ? ? ? ? ? ? Dental ?no notable dental hx. ? ?  ?Pulmonary ?neg pulmonary ROS,  ?  ?Pulmonary exam normal ? ? ? ? ? ? ? Cardiovascular ?negative cardio ROS ?Normal cardiovascular exam ? ? ?  ?Neuro/Psych ?  ? GI/Hepatic ?Neg liver ROS,   ?Endo/Other  ?negative endocrine ROSdiabetes ? Renal/GU ?negative Renal ROS  ?negative genitourinary ?  ?Musculoskeletal ?negative musculoskeletal ROS ?(+)  ? Abdominal ?(+) + obese,   ?Peds ? Hematology ?negative hematology ROS ?(+)   ?Anesthesia Other Findings ? ? Reproductive/Obstetrics ?(+) Pregnancy ? ?  ? ? ? ? ? ? ? ? ? ? ? ? ? ?  ?  ? ? ? ? ? ? ? ? ?Anesthesia Physical ?Anesthesia Plan ? ?ASA: 2 ? ?Anesthesia Plan: Epidural  ? ?Post-op Pain Management:   ? ?Induction:  ? ?PONV Risk Score and Plan:  ? ?Airway Management Planned:  ? ?Additional Equipment:  ? ?Intra-op Plan:  ? ?Post-operative Plan:  ? ?Informed Consent: I have reviewed the patients History and Physical, chart, labs and discussed the procedure including the risks, benefits and alternatives for the proposed anesthesia with the patient or authorized representative who has indicated his/her understanding and acceptance.  ? ? ? ? ? ?Plan Discussed with:  ? ?Anesthesia Plan Comments:   ? ? ? ? ? ? ?Anesthesia Quick Evaluation ? ?

## 2022-04-01 NOTE — Anesthesia Procedure Notes (Signed)
Epidural ?Patient location during procedure: OB ?Start time: 04/01/2022 2:00 PM ?End time: 04/01/2022 2:04 PM ? ?Staffing ?Anesthesiologist: Lyn Hollingshead, MD ?Performed: anesthesiologist  ? ?Preanesthetic Checklist ?Completed: patient identified, IV checked, site marked, risks and benefits discussed, surgical consent, monitors and equipment checked, pre-op evaluation and timeout performed ? ?Epidural ?Patient position: sitting ?Prep: DuraPrep and site prepped and draped ?Patient monitoring: continuous pulse ox and blood pressure ?Approach: midline ?Location: L3-L4 ?Injection technique: LOR air ? ?Needle:  ?Needle type: Tuohy  ?Needle gauge: 17 G ?Needle length: 9 cm and 9 ?Needle insertion depth: 6 cm ?Catheter type: closed end flexible ?Catheter size: 19 Gauge ?Catheter at skin depth: 11 cm ?Test dose: negative and Other ? ?Assessment ?Events: blood not aspirated, injection not painful, no injection resistance, no paresthesia and negative IV test ? ?Additional Notes ?Reason for block:procedure for pain ? ? ? ?

## 2022-04-01 NOTE — H&P (Signed)
Melinda Smith is a 41 y.o. female presenting for IOL for AMA and GDM. ?OB History   ? ? Gravida  ?6  ? Para  ?2  ? Term  ?2  ? Preterm  ?   ? AB  ?3  ? Living  ?2  ?  ? ? SAB  ?3  ? IAB  ?   ? Ectopic  ?   ? Multiple  ?0  ? Live Births  ?2  ?   ?  ?  ? ?Past Medical History:  ?Diagnosis Date  ? Anxiety   ? Chronic constipation   ? Chronic fatigue 2010  ? Chronic headaches   ? GERD (gastroesophageal reflux disease)   ? Gestational diabetes   ? Hemorrhoids   ? 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 Kidney stone   ? PCOS (polycystic ovarian syndrome)   ? Tubular adenoma of colon   ? ?Past Surgical History:  ?Procedure Laterality Date  ? DILATION AND CURETTAGE OF UTERUS    ? KNEE ARTHROSCOPY    ? left x 4  ? MANDIBLE SURGERY    ? ?Family History: family history includes Alcohol abuse in an other family member; COPD in her paternal grandmother; Cancer in her father and paternal grandfather; Celiac disease in her brother; Colon cancer in an other family member; Diabetes in her maternal grandmother; Diverticulosis in her mother; Hyperlipidemia in her mother; Multiple sclerosis in her mother and sister. ?Social History:  reports that she has never smoked. She has never used smokeless tobacco. She reports that she does not drink alcohol and does not use drugs. ? ? ?  ?Maternal Diabetes: Yes:  Diabetes Type:  Diet controlled ?Genetic Screening: Normal ?Maternal Ultrasounds/Referrals: Normal ?Fetal Ultrasounds or other Referrals:  None ?Maternal Substance Abuse:  No ?Significant Maternal Medications:  None ?Significant Maternal Lab Results:  None ?Other Comments:  None ? ?Review of Systems  ?Constitutional: Negative.   ?All other systems reviewed and are negative. ?Maternal Medical History:  ?Reason for admission: Contractions.  ? ?Contractions: Onset was 13-24 hours ago.   ?Frequency: irregular.   ?Perceived severity is mild.   ?Fetal activity: Perceived fetal activity is normal.   ?Prenatal complications: no prenatal  complications ?Prenatal Complications - Diabetes: gestational. ? ?  ?unknown if currently breastfeeding. ?Maternal Exam:  ?Uterine Assessment: Contraction strength is mild.  Contraction frequency is rare.  ?Abdomen: Patient reports no abdominal tenderness. Fetal presentation: vertex ?Introitus: Normal vulva. Normal vagina.  Ferning test: not done.  ?Nitrazine test: not done. ?Amniotic fluid character: not assessed. ?Pelvis: adequate for delivery.   ?Cervix: Cervix evaluated by digital exam.   ? ?Physical Exam ?Constitutional:   ?   Appearance: Normal appearance. She is normal weight.  ?HENT:  ?   Head: Normocephalic and atraumatic.  ?Cardiovascular:  ?   Rate and Rhythm: Normal rate and regular rhythm.  ?Pulmonary:  ?   Effort: Pulmonary effort is normal.  ?   Breath sounds: Normal breath sounds.  ?Abdominal:  ?   General: Abdomen is flat. Bowel sounds are normal.  ?   Palpations: Abdomen is soft.  ?Genitourinary: ?   General: Normal vulva.  ?Musculoskeletal:     ?   General: Normal range of motion.  ?Skin: ?   General: Skin is warm and dry.  ?Neurological:  ?   General: No focal deficit present.  ?   Mental Status: She is alert and oriented to person, place, and time.  ?Psychiatric:     ?  Mood and Affect: Mood normal.     ?   Behavior: Behavior normal.  ?  ?Prenatal labs: ?ABO, Rh: O/Positive/-- (08/03 0000) ?Antibody: Negative (08/03 0000) ?Rubella: Immune (09/14 0000) ?RPR: Nonreactive (09/14 0000)  ?HBsAg: Negative (09/14 0000)  ?HIV: Non-reactive (09/14 0000)  ?GBS: Negative/-- (03/09 0000)  ? ?Assessment/Plan: ?39wk IUP ?GDM ?AMA ?IOL  ? ?Melinda Smith J ?04/01/2022, 10:00 AM ? ? ? ? ?

## 2022-04-02 LAB — CBC
HCT: 30.2 % — ABNORMAL LOW (ref 36.0–46.0)
Hemoglobin: 9.9 g/dL — ABNORMAL LOW (ref 12.0–15.0)
MCH: 28.4 pg (ref 26.0–34.0)
MCHC: 32.8 g/dL (ref 30.0–36.0)
MCV: 86.8 fL (ref 80.0–100.0)
Platelets: 193 10*3/uL (ref 150–400)
RBC: 3.48 MIL/uL — ABNORMAL LOW (ref 3.87–5.11)
RDW: 12.8 % (ref 11.5–15.5)
WBC: 9.4 10*3/uL (ref 4.0–10.5)
nRBC: 0 % (ref 0.0–0.2)

## 2022-04-02 MED ORDER — MAGNESIUM OXIDE -MG SUPPLEMENT 400 (240 MG) MG PO TABS
400.0000 mg | ORAL_TABLET | Freq: Every day | ORAL | Status: DC
  Administered 2022-04-02 – 2022-04-03 (×2): 400 mg via ORAL
  Filled 2022-04-02 (×2): qty 1

## 2022-04-02 MED ORDER — POLYSACCHARIDE IRON COMPLEX 150 MG PO CAPS
150.0000 mg | ORAL_CAPSULE | Freq: Every day | ORAL | Status: DC
  Administered 2022-04-02 – 2022-04-03 (×2): 150 mg via ORAL
  Filled 2022-04-02 (×2): qty 1

## 2022-04-02 NOTE — Anesthesia Postprocedure Evaluation (Signed)
Anesthesia Post Note ? ?Patient: Melinda Smith ? ?Procedure(s) Performed: AN AD HOC LABOR EPIDURAL ? ?  ? ?Patient location during evaluation: Mother Baby ?Anesthesia Type: Epidural ?Level of consciousness: awake ?Pain management: satisfactory to patient ?Vital Signs Assessment: post-procedure vital signs reviewed and stable ?Respiratory status: spontaneous breathing ?Cardiovascular status: stable ?Anesthetic complications: no ? ? ?No notable events documented. ? ?Last Vitals:  ?Vitals:  ? 04/02/22 0030 04/02/22 0450  ?BP: (!) 121/53 126/79  ?Pulse: 96 82  ?Resp: 16 18  ?Temp: 36.7 ?C 36.7 ?C  ?SpO2: 97% 97%  ?  ?Last Pain:  ?Vitals:  ? 04/02/22 0657  ?TempSrc:   ?PainSc: 1   ? ?Pain Goal:   ? ?  ?  ?  ?  ?  ?  ?  ? ?Melinda Smith ? ? ? ? ?

## 2022-04-02 NOTE — Social Work (Signed)
MOB was referred for history of anxiety. ? ?* Referral screened out by Clinical Social Worker because none of the following criteria appear to apply: ?~ History of anxiety/depression during this pregnancy, or of post-partum depression following prior delivery. Edinburgh score: 1. No concerns with anxiety were noted in [facility] records.  ? ?~ Diagnosis of anxiety and/or depression within last 3 years. Per chart review, anxiety noted in 2009.  ?OR ? ?* MOB's symptoms currently being treated with medication and/or therapy. ? ?Please contact the Clinical Social Worker if needs arise, by [facility] request, or if MOB scores greater than 9/yes to question 10 on Edinburgh Postpartum Depression Screen.  ? ?Melinda Smith, MSW, LCSW ?[facility] [place]  ?Clinical Social Worker  ?[phone] ?04/02/2022  9:28 AM  ?

## 2022-04-02 NOTE — Progress Notes (Signed)
? ? ?  PPD # 1 S/P NSVD ? ?Live born female  ?Birth Weight: 8 lb 7.5 oz (3840 g) ?APGAR: 9, 9 ? ?Newborn Delivery   ?Birth date/time: 04/01/2022 17:16:00 ?Delivery type: Vaginal, Spontaneous ?  ?  ?Baby name: Emmelynn ?Delivering provider: Brien Few  ?Episiotomy:None  ? ?Lacerations:2nd degree;Perineal  ? ?Feeding: breast ? ?Pain control at delivery: Epidural  ? ?S:  Reports feeling well. ?            Tolerating po/ No nausea or vomiting ?            Bleeding is light ?            Pain controlled with acetaminophen and ibuprofen (OTC) ?            Up ad lib / ambulatory / voiding without difficulties  ? ?O:  A & O x 3, in no apparent distress  ?            VS:  ?Vitals:  ? 04/01/22 2011 04/01/22 2129 04/02/22 0030 04/02/22 0450  ?BP: 133/65 127/71 (!) 121/53 126/79  ?Pulse: 98 (!) 103 96 82  ?Resp: '18 18 16 18  '$ ?Temp: 98 ?F (36.7 ?C) 98.2 ?F (36.8 ?C) 98.1 ?F (36.7 ?C) 98.1 ?F (36.7 ?C)  ?TempSrc: Oral Oral Oral Oral  ?SpO2: 99% 97% 97% 97%  ?Weight:      ?Height:      ? ? LABS:  ?Recent Labs  ?  04/01/22 ?1032 04/02/22 ?0518  ?WBC 9.8 9.4  ?HGB 10.5* 9.9*  ?HCT 32.4* 30.2*  ?PLT 224 193  ? ? Blood type: --/--/O POS (04/05 1030) ? Rubella: Immune (09/14 0000)  ? I&O: I/O last 3 completed shifts: ?In: -  ?Out: 600 [Urine:200; Blood:400] ?         No intake/output data recorded. ? ? ? Gen: AAO x 3, NAD ? Abdomen: soft, non-tender, non-distended ?            Fundus: firm, non-tender, U-1 ? Perineum: repair intact ? Lochia: small ? Extremities: trace pedal edema, no calf pain or tenderness ?  ? ?A/P: PPD # 1 41 y.o., [id number]  ? Principal Problem: ?  Postpartum care following vaginal delivery 4/5 ?Active Problems: ?  SVD (spontaneous vaginal delivery) ?  Encounter for induction of labor ?  GDM (gestational diabetes mellitus) ? - follow up outpatient 6 wks ?  Iron deficiency anemia ? - asymptomatic ? - started oral Fe and Mag Ox ? Doing well - stable status ? Routine post partum orders ? Anticipate discharge  tomorrow ?  ? ?Juliene Pina, MSN, CNM ?04/02/2022, 8:44 AM ? ? ?  ?

## 2022-04-03 MED ORDER — IBUPROFEN 600 MG PO TABS: 600.0000 mg | ORAL_TABLET | Freq: Four times a day (QID) | ORAL | 0 refills | Status: DC

## 2022-04-03 NOTE — Discharge Summary (Signed)
? ?  Postpartum Discharge Summary ? ?Date of Service updated ? ?   ?Patient Name: Melinda Smith ?DOB: [birth year] ?MRN: [id number] ? ?Date of admission: 04/01/2022 ?Delivery date:04/01/2022  ?Delivering provider: Brien Few  ?Date of discharge: 04/03/2022 ? ?Admitting diagnosis: Encounter for induction of labor [Z34.90] ?Intrauterine pregnancy: [redacted]w[redacted]d    ?Secondary diagnosis:  Principal Problem: ?  Postpartum care following vaginal delivery 4/5 ?Active Problems: ?  SVD (spontaneous vaginal delivery) ?  Encounter for induction of labor ?  GDM (gestational diabetes mellitus) ?  Iron deficiency anemia ? ?Additional problems: none    ?Discharge diagnosis: Term Pregnancy Delivered and GDM A1                                              ?Post partum procedures: none ?Complications: None ? ?Hospital course: Induction of Labor With Vaginal Delivery   ?41y.o. yo [id number] admitted to the hospital 04/01/2022 for induction of labor.  Indication for induction: A1 DM.  Patient had an uncomplicated labor course as follows: ?Membrane Rupture Time/Date: 12:37 PM ,04/01/2022   ?Delivery Method:Vaginal, Spontaneous  ?Episiotomy: None  ?Lacerations:  2nd degree;Perineal  ?Details of delivery can be found in separate delivery note.  Patient had a routine postpartum course. Patient is discharged home 04/03/22. ? ?Newborn Data: ?Birth date:04/01/2022  ?Birth time:5:16 PM  ?Gender:Female  ?Living status:Living  ?Apgars:9 ,9  ?Weight:3840 g  ? ? ?Physical exam  ?Vitals:  ? 04/02/22 0949 04/02/22 1621 04/02/22 2100 04/03/22 0546  ?BP: 135/77 138/68 129/84 127/79  ?Pulse: 95 93 89 90  ?Resp: '20  18 18  '$ ?Temp: 98.3 ?F (36.8 ?C) 98 ?F (36.7 ?C) 97.9 ?F (36.6 ?C) 98.4 ?F (36.9 ?C)  ?TempSrc: Oral Axillary Oral Oral  ?SpO2: 98%  99% 97%  ?Weight:      ?Height:      ? ?General: alert, cooperative, and no distress ?Lochia: appropriate ?Uterine Fundus: firm ?Incision: Healing well with no significant drainage ?DVT Evaluation: No evidence of DVT  seen on physical exam. ?Labs: ?Lab Results  ?Component Value Date  ? WBC 9.4 04/02/2022  ? HGB 9.9 (L) 04/02/2022  ? HCT 30.2 (L) 04/02/2022  ? MCV 86.8 04/02/2022  ? PLT 193 04/02/2022  ? ?   ? View : No data to display.  ?  ?  ?  ? ?Edinburgh Score: ? ?  04/01/2022  ?  9:29 PM  ?EdFlavia Shipperostnatal Depression Scale Screening Tool  ?I have been able to laugh and see the funny side of things. 0  ?I have looked forward with enjoyment to things. 0  ?I have blamed myself unnecessarily when things went wrong. 0  ?I have been anxious or worried for no good reason. 0  ?I have felt scared or panicky for no good reason. 0  ?Things have been getting on top of me. 1  ?I have been so unhappy that I have had difficulty sleeping. 0  ?I have felt sad or miserable. 0  ?I have been so unhappy that I have been crying. 0  ?The thought of harming myself has occurred to me. 0  ?Edinburgh Postnatal Depression Scale Total 1  ? ? ? ? ?After visit meds:  ?Allergies as of 04/03/2022   ? ?   Reactions  ? Benzoyl Perox-clindamycin Phos [clindamycin Phos-benzoyl Perox] Itching  ? Ketek [telithromycin]  Itching, Nausea Only  ? ?  ? ?  ?Medication List  ?  ? ?STOP taking these medications   ? ?acetaminophen 325 MG tablet ?Commonly known as: Tylenol ?  ?aspirin 81 MG chewable tablet ?  ?coconut oil Oil ?  ?folic acid 1 MG tablet ?Commonly known as: FOLVITE ?  ? ?  ? ?TAKE these medications   ? ?ibuprofen 600 MG tablet ?Commonly known as: ADVIL ?Take 1 tablet (600 mg total) by mouth every 6 (six) hours. ?  ?MULTI-VITAMIN DAILY PO ?Take 1 tablet by mouth daily. ?  ? ?  ? ? ? ?Discharge home in stable condition ?Infant Feeding: Breast ?Infant Disposition:home with mother ?Discharge instruction: per After Visit Summary and Postpartum booklet. ?Activity: Advance as tolerated. Pelvic rest for 6 weeks.  ?Diet: carb modified diet ?Anticipated Birth Control: Unsure ?Postpartum Appointment:6 weeks ?Additional Postpartum F/U:  n/a ?Future Appointments:No future  appointments. ?Follow up Visit: ? ? ?  ? ?04/03/2022 ?Charyl Bigger, MD ? ? ?

## 2022-04-03 NOTE — Progress Notes (Signed)
No c/o, nml lochia, voids w/o difficulty; breastfeeding ? ?Patient Vitals for the past 24 hrs: ? BP Temp Temp src Pulse Resp SpO2  ?04/03/22 0546 127/79 98.4 ?F (36.9 ?C) Oral 90 18 97 %  ?04/02/22 2100 129/84 97.9 ?F (36.6 ?C) Oral 89 18 99 %  ?04/02/22 1621 138/68 98 ?F (36.7 ?C) Axillary 93 -- --  ? ?A&ox3 ?Nml respirations ?Abd: soft,nt, fundus firm and below umb ?LE: no edema, nt bilat ? ?  Latest Ref Rng & Units 04/02/2022  ?  5:18 AM 04/01/2022  ? 10:32 AM 01/06/2019  ?  5:51 AM  ?CBC  ?WBC 4.0 - 10.5 K/uL 9.4   9.8   11.1    ?Hemoglobin 12.0 - 15.0 g/dL 9.9   10.5   11.2    ?Hematocrit 36.0 - 46.0 % 30.2   32.4   33.5    ?Platelets 150 - 400 K/uL 193   224   159    ? ?A/P: ppd2 s/p svd ?Doing well, d/c home today; f/u office 6 wk ?Gdm - f/u office ?IDA - asymptomatic - iron q day ?Girl- breastfeeding, no issues ?

## 2022-04-08 DIAGNOSIS — O165 Unspecified maternal hypertension, complicating the puerperium: Secondary | ICD-10-CM | POA: Insufficient documentation

## 2022-04-13 ENCOUNTER — Telehealth (HOSPITAL_COMMUNITY): Payer: Self-pay | Admitting: *Deleted

## 2022-04-13 NOTE — Telephone Encounter (Signed)
Voice mailbox is full. Unable to leave message. ? ?Odis Hollingshead, RN 04-13-2022 at 10:16am ?

## 2023-08-16 NOTE — Progress Notes (Unsigned)
08/17/2023 [name] [id number] 03-16-1981  Referring provider: [facility] Primary GI doctor: Dr. Adela Lank  ASSESSMENT AND PLAN:   Gastroesophageal reflux disease without esophagitis Progressively worse, has had for years Currently breast feeding, will increase pepcid from 20 mg to 40 mg Discussed with patient and she will like EGD to evaluate, can check for H pylori, PUD, gastrtis Lifestyle changes discussed, avoid NSAIDS, ETOH Weight loss discussed with the patient Consider RUQ [place] if EGD negative, LFTs normal  Loose stools Only on days she works, not on weekends or vacation Likely IBS, no bloody stools.  Can do trial of IBGARD daily Add on citracel/benefiber FODMAP,  and lifestyle changes discussed  Colon adenoma 04/30/2021 colon Dr. Adela Lank 3 TA, recall 2025  Patient Care Team: [facility] as PCP - General (Family Medicine)  HISTORY OF PRESENT ILLNESS: 42 y.o. female with a past medical history of personal history of TA, GERD and others listed below presents for evaluation of GERD.   Colonoscopy 02/2013 - 15mm polyp removed from descending colon, path c/w adenoma  2017 2 small adenomatous polyps 04/01/2021 colonoscopy Dr. Adela Lank for personal history of colon polyps good bowel prep 3 TA polyps 2 to 4 mm transverse colon, diverticulosis, internal hemorrhoids, recall 3 years (04/01/2024.) Great-grandmother history of colon cancer, no family history of IBD.  She states she use to battle constipation but after her last colon in 2022 she has had more loose stools multiple times a day. 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 No blood in stool, no weight loss, no nocturnal stools.   Patient reports GERD worse after each pregnancy ( has 3 kids), and she gained weight with each kid, no weight loss..  She states recently it has been worse with night time GERD, hoarseness, worse with bending over.  Denies any associated AB  pain, nausea, vomiting, melena. He has occ dysphagia but intermittent.  She  reports AB bloating.  Has tried tums which does help calm it down and the OTC pepcid daily for last 4-5 months without relief.   She reports family history of gallbladder issues, sister s/p cholecystectomy after her last child.  She denies blood thinner use.  She denies NSAID use, had post partum but mainly tyelnol She denies ETOH use.   She denies tobacco use.  She denies drug use.    She  reports that she has never smoked. She has never used smokeless tobacco. She reports that she does not drink alcohol and does not use drugs.  RELEVANT LABS AND IMAGING: CBC    Component Value Date/Time   WBC 9.4 04/02/2022 0518   RBC 3.48 (L) 04/02/2022 0518   HGB 9.9 (L) 04/02/2022 0518   HCT 30.2 (L) 04/02/2022 0518   PLT 193 04/02/2022 0518   MCV 86.8 04/02/2022 0518   MCH 28.4 04/02/2022 0518   MCHC 32.8 04/02/2022 0518   RDW 12.8 04/02/2022 0518   LYMPHSABS 1.6 02/20/2013 1201   MONOABS 0.3 02/20/2013 1201   EOSABS 0.0 02/20/2013 1201   BASOSABS 0.0 02/20/2013 1201    Current Medications:   Current Outpatient Medications (Endocrine & Metabolic):    metFORMIN (GLUCOPHAGE) 500 MG tablet, Take by mouth 2 (two) times daily with a meal.    Current Outpatient Medications (Analgesics):    ibuprofen (ADVIL) 600 MG tablet, Take 1 tablet (600 mg total) by mouth every 6 (six) hours.   Current Outpatient Medications (Other):    famotidine (PEPCID)  40 MG tablet, Take 1 tablet (40 mg total) by mouth daily.   Multiple Vitamin (MULTI-VITAMIN DAILY PO), Take 1 tablet by mouth daily.  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 Family History:  Her family history includes Alcohol abuse in an other family member; COPD in her paternal grandmother; Cancer in her father and paternal grandfather; Celiac disease in her brother; Colon cancer in an other family member; Diabetes in her maternal grandmother; Diverticulosis in her mother; Hyperlipidemia in her mother; Multiple sclerosis in her mother and sister.  REVIEW OF SYSTEMS  : All other systems reviewed and negative except where noted in the History of Present Illness.  PHYSICAL EXAM: BP 124/76   Pulse 79   Ht 5' 6.25" (1.683 m)   Wt 211 lb (95.7 kg)   SpO2 99%   BMI 33.80 kg/m  General Appearance: Well nourished, in no apparent distress. Head:   Normocephalic and atraumatic. Eyes:  sclerae anicteric,conjunctive pink  Respiratory: Respiratory effort normal, BS equal bilaterally without rales, rhonchi, wheezing. Cardio: RRR with no MRGs. Peripheral pulses intact.  Abdomen: Soft,  Obese ,active bowel sounds. mild tenderness in the epigastrium. Without guarding and Without rebound. No masses. Rectal: declines Musculoskeletal: Full ROM, Normal gait. Without edema. Skin:  Dry and intact without significant lesions or rashes Neuro: Alert and  oriented x4;  No focal deficits. Psych:  Cooperative. Normal mood and affect.    Doree Albee, PA-C 11:11 AM

## 2023-08-17 ENCOUNTER — Ambulatory Visit: Payer: 59 | Admitting: Physician Assistant

## 2023-08-17 ENCOUNTER — Encounter: Payer: Self-pay | Admitting: Physician Assistant

## 2023-08-17 VITALS — BP 124/76 | HR 79 | Ht 66.25 in | Wt 211.0 lb

## 2023-08-17 DIAGNOSIS — R197 Diarrhea, unspecified: Secondary | ICD-10-CM | POA: Diagnosis not present

## 2023-08-17 DIAGNOSIS — D126 Benign neoplasm of colon, unspecified: Secondary | ICD-10-CM

## 2023-08-17 DIAGNOSIS — K219 Gastro-esophageal reflux disease without esophagitis: Secondary | ICD-10-CM | POA: Diagnosis not present

## 2023-08-17 MED ORDER — FAMOTIDINE 40 MG PO TABS: 40.0000 mg | ORAL_TABLET | Freq: Every day | ORAL | 0 refills | Status: DC

## 2023-08-17 MED ORDER — PANTOPRAZOLE SODIUM 40 MG PO TBEC
40.0000 mg | DELAYED_RELEASE_TABLET | Freq: Every day | ORAL | 0 refills | Status: DC
Start: 2023-08-17 — End: 2023-08-17

## 2023-08-17 NOTE — Patient Instructions (Addendum)
   Please take your proton pump inhibitor medication, pepcid 40 mg once daily  Please take this medication 30 minutes to 1 hour before meals- this makes it more effective.  Avoid spicy and acidic foods Avoid fatty foods Limit your intake of coffee, tea, alcohol, and carbonated drinks Work to maintain a healthy weight Keep the head of the bed elevated at least 3 inches with blocks or a wedge pillow if you are having any nighttime symptoms Stay upright for 2 hours after eating Avoid meals and snacks three to four hours before bedtime   Due April 2025 for colonoscopy if any worsening symptoms like blood in stool, weight loss, please call the office    First do a trial off milk/lactose products if you use them.  Add fiber like benefiber or citracel once a day Can do trial of IBGard which is over the counter for AB pain- Take 1-2 capsules once a day for maintence or twice a day during a flare  Can send in an anti spasm medication, Bentyl, to take as needed Please try to decrease stress. consider talking with PCP about anti anxiety medication or try head space app for meditation.     FODMAP stands for fermentable oligo-, di-, mono-saccharides and polyols (1). These are the scientific terms used to classify groups of carbs that are difficult for our body to digest and that are notorious for triggering digestive symptoms like bloating, gas, loose stools and stomach pain.   You can try low FODMAP diet  - start with eliminating just one column at a time that you feel may be a trigger for you. - the table at the very bottom contains foods that are low in FODMAPs   Sometimes trying to eliminate the FODMAP's from your diet is difficult or tricky, if you are stuggling with trying to do the elimination diet you can try an enzyme.  There is a food enzymes that you sprinkle in or on your food that helps break down the FODMAP. You can read more about the enzyme by going to this  site: https://fodzyme.com/    Due to recent changes in healthcare laws, you may see the results of your imaging and laboratory studies on MyChart before your provider has had a chance to review them.  We understand that in some cases there may be results that are confusing or concerning to you. Not all laboratory results come back in the same time frame and the provider may be waiting for multiple results in order to interpret others.  Please give [place] 48 hours in order for your provider to thoroughly review all the results before contacting the office for clarification of your results.    I appreciate the  opportunity to care for you  Thank You   [facility]

## 2023-08-17 NOTE — Progress Notes (Signed)
Agree with assessment and plan as outlined.  

## 2023-08-18 ENCOUNTER — Encounter: Payer: Self-pay | Admitting: Gastroenterology

## 2023-08-22 ENCOUNTER — Encounter: Payer: Self-pay | Admitting: Certified Registered Nurse Anesthetist

## 2023-08-27 ENCOUNTER — Ambulatory Visit (AMBULATORY_SURGERY_CENTER): Payer: 59 | Admitting: Gastroenterology

## 2023-08-27 ENCOUNTER — Encounter: Payer: Self-pay | Admitting: Gastroenterology

## 2023-08-27 VITALS — BP 125/84 | HR 89 | Temp 98.0°F | Resp 11 | Ht 66.0 in | Wt 211.0 lb

## 2023-08-27 DIAGNOSIS — K2100 Gastro-esophageal reflux disease with esophagitis, without bleeding: Secondary | ICD-10-CM

## 2023-08-27 DIAGNOSIS — K219 Gastro-esophageal reflux disease without esophagitis: Secondary | ICD-10-CM

## 2023-08-27 MED ORDER — OMEPRAZOLE 20 MG PO CPDR: 20.0000 mg | DELAYED_RELEASE_CAPSULE | Freq: Every day | ORAL | 0 refills | Status: DC

## 2023-08-27 MED ORDER — SODIUM CHLORIDE 0.9 % IV SOLN: 500.0000 mL | Freq: Once | INTRAVENOUS | Status: DC

## 2023-08-27 MED ORDER — OMEPRAZOLE 20 MG PO CPDR: 20.0000 mg | DELAYED_RELEASE_CAPSULE | Freq: Every day | ORAL | 1 refills | Status: DC

## 2023-08-27 NOTE — Patient Instructions (Signed)
Resume previous diet and medications. Recommend switching from Pepcid to omeprazole. Start at 20 mg per day for 1 month trial, can increase dose if needed. Long term use lowest dose of medication needed to control symptoms.  YOU HAD AN ENDOSCOPIC PROCEDURE TODAY AT THE [facility]:   Refer to the procedure report that was given to you for any specific questions about what was found during the examination.  If the procedure report does not answer your questions, please call your gastroenterologist to clarify.  If you requested that your care partner not be given the details of your procedure findings, then the procedure report has been included in a sealed envelope for you to review at your convenience later.  YOU SHOULD EXPECT: Some feelings of bloating in the abdomen. Passage of more gas than usual.  Walking can help get rid of the air that was put into your GI tract during the procedure and reduce the bloating. If you had a lower endoscopy (such as a colonoscopy or flexible sigmoidoscopy) you may notice spotting of blood in your stool or on the toilet paper. If you underwent a bowel prep for your procedure, you may not have a normal bowel movement for a few days.  Please Note:  You might notice some irritation and congestion in your nose or some drainage.  This is from the oxygen used during your procedure.  There is no need for concern and it should clear up in a day or so.  SYMPTOMS TO REPORT IMMEDIATELY:  Following upper endoscopy (EGD)  Vomiting of blood or coffee ground material  New chest pain or pain under the shoulder blades  Painful or persistently difficult swallowing  New shortness of breath  Fever of 100F or higher  Black, tarry-looking stools  For urgent or emergent issues, a gastroenterologist can be reached at any hour by calling [phone]. Do not use MyChart messaging for urgent concerns.    DIET:  We do recommend a small meal at first, but then you may  proceed to your regular diet.  Drink plenty of fluids but you should avoid alcoholic beverages for 24 hours.  ACTIVITY:  You should plan to take it easy for the rest of today and you should NOT DRIVE or use heavy machinery until tomorrow (because of the sedation medicines used during the test).    FOLLOW UP: Our staff will call the number listed on your records the next business day following your procedure.  We will call around 7:15- 8:00 am to check on you and address any questions or concerns that you may have regarding the information given to you following your procedure. If we do not reach you, we will leave a message.     If any biopsies were taken you will be contacted by phone or by letter within the next 1-3 weeks.  Please call us at [phone] if you have not heard about the biopsies in 3 weeks.    SIGNATURES/CONFIDENTIALITY: You and/or your care partner have signed paperwork which will be entered into your electronic medical record.  These signatures attest to the fact that that the information above on your After Visit Summary has been reviewed and is understood.  Full responsibility of the confidentiality of this discharge information lies with you and/or your care-partner.

## 2023-08-27 NOTE — Progress Notes (Signed)
[id number] Robinul 0.1 mg IV given due large amount of secretions upon assessment.  MD made aware, vss

## 2023-08-27 NOTE — Op Note (Signed)
[facility] Patient Name: Melinda Smith Procedure Date: 08/27/2023 8:00 AM MRN: [id number] Endoscopist: Viviann Spare P. Adela Lank , MD, [id number] Age: 42 Referring MD:  Date of Birth: March 15, 1981 Gender: Female Account #: [id number] Procedure:                Upper GI endoscopy Indications:              longstanding gastro-esophageal reflux disease -                            poorly controlled with pepcid currently Medicines:                Monitored Anesthesia Care Procedure:                Pre-Anesthesia Assessment:                           - Prior to the procedure, a History and Physical                            was performed, and patient medications and                            allergies were reviewed. The patient's tolerance of                            previous anesthesia was also reviewed. The risks                            and benefits of the procedure and the sedation                            options and risks were discussed with the patient.                            All questions were answered, and informed consent                            was obtained. Prior Anticoagulants: The patient has                            taken no anticoagulant or antiplatelet agents. ASA                            Grade Assessment: II - A patient with mild systemic                            disease. After reviewing the risks and benefits,                            the patient was deemed in satisfactory condition to                            undergo the procedure.  After obtaining informed consent, the endoscope was                            passed under direct vision. Throughout the                            procedure, the patient's blood pressure, pulse, and                            oxygen saturations were monitored continuously. The                            Olympus Scope [id number] was introduced through the                            mouth, and  advanced to the second part of duodenum.                            The upper GI endoscopy was accomplished without                            difficulty. The patient tolerated the procedure                            well. Scope In: Scope Out: Findings:                 Esophagogastric landmarks were identified: the                            Z-line was found at 38 cm, the gastroesophageal                            junction was found at 38 cm and the upper extent of                            the gastric folds was found at 40 cm from the                            incisors.                           A 2 cm hiatal hernia was present.                           The Z-line was slightly irregular but did not meet                            criteria for Barrett's biopsies.                           LA Grade A esophagitis was found 38 cm from the                            incisors.  The gastroesophageal flap valve was visualized                            endoscopically and classified as Hill Grade III                            (minimal fold, loose to endoscope, hiatal hernia                            likely).                           The exam of the esophagus was otherwise normal.                           The entire examined stomach was normal.                           The examined duodenum was normal. 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 Complications:            No immediate complications. Estimated blood loss:                            None. Estimated Blood Loss:     Estimated blood loss: none. Impression:               - Esophagogastric landmarks identified.                           - 2 cm hiatal hernia.                           - Z-line slightly irregular but no Barrett's.                           - LA Grade A reflux esophagitis.                           - Gastroesophageal flap valve classified as Hill                             Grade III (minimal fold, loose to endoscope, hiatal                            hernia likely).                           - Normal stomach.                           - Normal examined duodenum. Recommendation:           - Patient has a contact number available for  emergencies. The signs and symptoms of potential                            delayed complications were discussed with the                            patient. Return to normal activities tomorrow.                            Written discharge instructions were provided to the                            patient.                           - Resume previous diet.                           - Continue present medications.                           - Recommend switch from pepcid to omeprazole. Start                            at 20mg  / day for 1 month trial, can increase dose                            if needed. Long term use lowest dose of medication                            needed to control symptoms. Viviann Spare P. Adela Lank, MD 08/27/2023 8:25:39 AM This report has been signed electronically.

## 2023-08-27 NOTE — Progress Notes (Signed)
History and Physical Interval Note: Seen on 08/17/23 - no interval changes. History of GERD, on pepcid with ongoing symptoms, progressively worse over years. EGD to further evaluate. Pepcid 40mg  / daily and still having breakthrough. NO dysphagia. Otherwise feels well and wants to proceed.  08/27/2023 8:04 AM  Melinda Smith  has presented today for endoscopic procedure(s), with the diagnosis of  Encounter Diagnosis  Name Primary?   Gastroesophageal reflux disease without esophagitis Yes  .  The various methods of evaluation and treatment have been discussed with the patient and/or family. After consideration of risks, benefits and other options for treatment, the patient has consented to  the endoscopic procedure(s).   The patient's history has been reviewed, patient examined, no change in status, stable for surgery.  I have reviewed the patient's chart and labs.  Questions were answered to the patient's satisfaction.    Harlin Rain, MD [facility]

## 2023-08-27 NOTE — Progress Notes (Signed)
VS by DT  Pt's states no medical or surgical changes since previsit or office visit.  

## 2023-08-27 NOTE — Progress Notes (Signed)
Report given to PACU, vss 

## 2023-08-31 ENCOUNTER — Telehealth: Payer: Self-pay

## 2023-08-31 NOTE — Telephone Encounter (Signed)
  Follow up Call-     08/27/2023    7:35 AM 04/01/2021    8:26 AM  Call back number  Post procedure Call Back phone  # [phone]  Permission to leave phone message Yes Yes     Patient questions:  Do you have a fever, pain , or abdominal swelling? No. Pain Score  0 *  Have you tolerated food without any problems? Yes.    Have you been able to return to your normal activities? Yes.    Do you have any questions about your discharge instructions: Diet   No. Medications  No. Follow up visit  No.  Do you have questions or concerns about your Care? No.  Actions: * If pain score is 4 or above: No action needed, pain <4.

## 2023-11-12 ENCOUNTER — Other Ambulatory Visit: Payer: Self-pay | Admitting: Physician Assistant

## 2024-02-19 ENCOUNTER — Other Ambulatory Visit: Payer: Self-pay | Admitting: Physician Assistant

## 2024-03-09 ENCOUNTER — Encounter: Payer: Self-pay | Admitting: Gastroenterology

## 2024-03-23 ENCOUNTER — Ambulatory Visit (AMBULATORY_SURGERY_CENTER): Admitting: *Deleted

## 2024-03-23 VITALS — Ht 66.25 in | Wt 209.0 lb

## 2024-03-23 DIAGNOSIS — Z860100 Personal history of colon polyps, unspecified: Secondary | ICD-10-CM

## 2024-03-23 MED ORDER — SUTAB 1479-225-188 MG PO TABS: 24.0000 | ORAL_TABLET | Freq: Once | ORAL | 0 refills | Status: AC

## 2024-03-23 NOTE — Progress Notes (Signed)
Pt's previsit is done over the phone and all paperwork (prep instructions) sent to patient.  Pt's name and DOB verified at the beginning of the previsit.  Pt denies any difficulty with ambulating.   No egg or soy allergy known to patient  No issues known to pt with past sedation with any surgeries or procedures Patient denies ever being told they had issues or difficulty with intubation  No FH of Malignant Hyperthermia Pt is not on diet pills Pt is not on  home 02  Pt is not on blood thinners  Pt denies issues with constipation  Pt is not on dialysis Pt denies any upcoming cardiac testing Pt encouraged to use to use Singlecare or Goodrx to reduce cost  Patient's chart reviewed by John Nulty CNRA prior to previsit and patient appropriate for the [facility].  Previsit completed and red dot placed by patient's name on their procedure day (on provider's schedule).    

## 2024-04-02 ENCOUNTER — Other Ambulatory Visit: Payer: Self-pay | Admitting: Gastroenterology

## 2024-04-17 ENCOUNTER — Encounter: Payer: Self-pay | Admitting: Gastroenterology

## 2024-04-18 ENCOUNTER — Encounter: Payer: Self-pay | Admitting: Certified Registered Nurse Anesthetist

## 2024-04-19 ENCOUNTER — Ambulatory Visit (AMBULATORY_SURGERY_CENTER): Admitting: Gastroenterology

## 2024-04-19 ENCOUNTER — Encounter: Payer: Self-pay | Admitting: Gastroenterology

## 2024-04-19 VITALS — BP 126/81 | HR 81 | Temp 98.0°F | Resp 12 | Ht 66.25 in | Wt 209.0 lb

## 2024-04-19 DIAGNOSIS — K648 Other hemorrhoids: Secondary | ICD-10-CM

## 2024-04-19 DIAGNOSIS — D127 Benign neoplasm of rectosigmoid junction: Secondary | ICD-10-CM | POA: Diagnosis not present

## 2024-04-19 DIAGNOSIS — Z860100 Personal history of colon polyps, unspecified: Secondary | ICD-10-CM

## 2024-04-19 DIAGNOSIS — Z1211 Encounter for screening for malignant neoplasm of colon: Secondary | ICD-10-CM

## 2024-04-19 DIAGNOSIS — K5730 Diverticulosis of large intestine without perforation or abscess without bleeding: Secondary | ICD-10-CM | POA: Diagnosis not present

## 2024-04-19 MED ORDER — SODIUM CHLORIDE 0.9 % IV SOLN: 500.0000 mL | Freq: Once | INTRAVENOUS | Status: DC

## 2024-04-19 NOTE — Patient Instructions (Signed)
 Please read handouts provided. Continue present medications. Await pathology results. Resume previous diet. Anticipate repeat colonoscopy in 5 years for screening.   YOU HAD AN ENDOSCOPIC PROCEDURE TODAY AT THE [facility]:   Refer to the procedure report that was given to you for any specific questions about what was found during the examination.  If the procedure report does not answer your questions, please call your gastroenterologist to clarify.  If you requested that your care partner not be given the details of your procedure findings, then the procedure report has been included in a sealed envelope for you to review at your convenience later.  YOU SHOULD EXPECT: Some feelings of bloating in the abdomen. Passage of more gas than usual.  Walking can help get rid of the air that was put into your GI tract during the procedure and reduce the bloating. If you had a lower endoscopy (such as a colonoscopy or flexible sigmoidoscopy) you may notice spotting of blood in your stool or on the toilet paper. If you underwent a bowel prep for your procedure, you may not have a normal bowel movement for a few days.  Please Note:  You might notice some irritation and congestion in your nose or some drainage.  This is from the oxygen used during your procedure.  There is no need for concern and it should clear up in a day or so.  SYMPTOMS TO REPORT IMMEDIATELY:  Following lower endoscopy (colonoscopy or flexible sigmoidoscopy):  Excessive amounts of blood in the stool  Significant tenderness or worsening of abdominal pains  Swelling of the abdomen that is new, acute  Fever of 100F or higher.  For urgent or emergent issues, a gastroenterologist can be reached at any hour by calling [phone]. Do not use MyChart messaging for urgent concerns.    DIET:  We do recommend a small meal at first, but then you may proceed to your regular diet.  Drink plenty of fluids but you should avoid  alcoholic beverages for 24 hours.  ACTIVITY:  You should plan to take it easy for the rest of today and you should NOT DRIVE or use heavy machinery until tomorrow (because of the sedation medicines used during the test).    FOLLOW UP: Our staff will call the number listed on your records the next business day following your procedure.  We will call around 7:15- 8:00 am to check on you and address any questions or concerns that you may have regarding the information given to you following your procedure. If we do not reach you, we will leave a message.     If any biopsies were taken you will be contacted by phone or by letter within the next 1-3 weeks.  Please call us  at [phone] if you have not heard about the biopsies in 3 weeks.    SIGNATURES/CONFIDENTIALITY: You and/or your care partner have signed paperwork which will be entered into your electronic medical record.  These signatures attest to the fact that that the information above on your After Visit Summary has been reviewed and is understood.  Full responsibility of the confidentiality of this discharge information lies with you and/or your care-partner.

## 2024-04-19 NOTE — Progress Notes (Signed)
 Report given to PACU, vss

## 2024-04-19 NOTE — Progress Notes (Signed)
 [facility] History and Physical   Primary Care Physician:  [facility] [place]   Reason for Procedure:   History of polyps  Plan:    colonoscopy     HPI: Melinda Smith is a 43 y.o. female  here for colonoscopy surveillance - history of advanced adenoma in 2014, polyps in 2017 and 2022.   Patient denies any bowel symptoms at this time. No family history of colon cancer known. Otherwise feels well without any cardiopulmonary symptoms.   I have discussed risks / benefits of anesthesia and endoscopic procedure with Crystall D Westley and they wish to proceed with the exams as outlined today.    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 Authorizing Provider  AUROVELA FE 1/20 1-20 MG-MCG tablet Take 1 tablet by mouth daily. 02/13/24  Yes [provider]  metFORMIN (GLUCOPHAGE) 500 MG tablet Take by mouth 3 (three) times daily.   Yes [provider]  omeprazole  (PRILOSEC) 20 MG capsule Take 1 capsule (20 mg total) by mouth daily. Please schedule a yearly follow up for further refills. Thank you 04/03/24  Yes Izaiyah Kleinman, Lendon Queen, MD  albuterol (VENTOLIN HFA) 108 (90 Base) MCG/ACT inhaler INHALE 2 PUFF EVERY FOUR HOURS AS NEEDED AS NEEDED FOR COUGH    [provider]  Multiple Vitamin  (MULTI-VITAMIN DAILY PO) Take 1 tablet by mouth daily.    [provider]    Current Outpatient Medications  Medication Sig Dispense Refill   AUROVELA FE 1/20 1-20 MG-MCG tablet Take 1 tablet by mouth daily.     metFORMIN (GLUCOPHAGE) 500 MG tablet Take by mouth 3 (three) times daily.     omeprazole  (PRILOSEC) 20 MG capsule Take 1 capsule (20 mg total) by mouth daily. Please schedule a yearly follow up for further refills. Thank you 90 capsule 0   albuterol (VENTOLIN HFA) 108 (90 Base) MCG/ACT inhaler INHALE 2 PUFF EVERY FOUR HOURS AS NEEDED AS NEEDED FOR COUGH     Multiple Vitamin (MULTI-VITAMIN DAILY PO) Take 1 tablet by mouth daily.     Current Facility-Administered Medications  Medication Dose Route Frequency Provider Last Rate Last Admin   0.9 %  sodium chloride  infusion  500 mL Intravenous Once Annaliyah Willig, Lendon Queen, MD        Allergies as of 04/19/2024 - Review Complete 04/19/2024  Allergen Reaction Noted   Benzoyl perox-clindamycin phos [clindamycin phos-benzoyl perox] Itching 09/04/2015   Ketek [telithromycin] Itching and Nausea Only 03/04/2015    Family History  Problem Relation Age of Onset   Diabetes Maternal Grandmother    Colon cancer Other  maternal great grandmother   Alcohol abuse Other    Celiac disease Brother    Diverticulosis Mother    Multiple sclerosis Mother    Hyperlipidemia Mother    Multiple sclerosis Sister    Cancer Father    COPD Paternal Grandmother    Cancer Paternal Grandfather    Colon polyps Neg Hx    Esophageal cancer Neg Hx    Rectal cancer Neg Hx    Stomach cancer Neg Hx     Social History   Socioeconomic History   Marital status: Married    Spouse name: Not on file   Number of children: 1   Years of education: Not on file   Highest education level: Not on file  Occupational History   Not on file  Tobacco Use   Smoking status: Never   Smokeless tobacco: Never  Vaping Use   Vaping status: Never Used  Substance  and Sexual Activity   Alcohol use: No   Drug use: No   Sexual activity: Yes    Birth control/protection: Pill  Other Topics Concern   Not on file  Social History Narrative   Not on file   Social Drivers of Health   Financial Resource Strain: Low Risk  (12/29/2018)   Overall Financial Resource Strain (CARDIA)    Difficulty of Paying Living Expenses: Not hard at all  Food Insecurity: No Food Insecurity (02/12/2022)   Hunger Vital Sign    Worried About Running Out of Food in the Last Year: Never true    Ran Out of Food in the Last Year: Never true  Transportation Needs: Unknown (12/29/2018)   PRAPARE - Administrator, Civil Service (Medical): No    Lack of Transportation (Non-Medical): Not on file  Physical Activity: Not on file  Stress: No Stress Concern Present (12/29/2018)   [employer] of Occupational Health - Occupational Stress Questionnaire    Feeling of Stress : Only a little  Social Connections: Not on file  Intimate Partner Violence: Not At Risk (12/29/2018)   Humiliation, Afraid, Rape, and Kick questionnaire    Fear of Current or Ex-Partner: No    Emotionally Abused: No    Physically Abused: No    Sexually Abused: No    Review of Systems: All other review of systems negative except as mentioned in the HPI.  Physical Exam: Vital signs BP 118/70   Pulse 80   Temp 98 F (36.7 C) (Temporal)   Ht 5' 6.25" (1.683 m)   Wt 209 lb (94.8 kg)   SpO2 100%   BMI 33.48 kg/m   General:   Alert,  Well-developed, pleasant and cooperative in NAD Lungs:  Clear throughout to auscultation.   Heart:  Regular rate and rhythm Abdomen:  Soft, nontender and nondistended.   Neuro/Psych:  Alert and cooperative. Normal mood and affect. A and O x 3  Christi Coward, MD [facility]

## 2024-04-19 NOTE — Progress Notes (Signed)
 Called to room to assist during endoscopic procedure.  Patient ID and intended procedure confirmed with present staff. Received instructions for my participation in the procedure from the performing physician.

## 2024-04-19 NOTE — Progress Notes (Signed)
 1610 Patient experiencing nausea and retching.  MD updated and Zofran  4 mg IV given, vss

## 2024-04-19 NOTE — Progress Notes (Signed)
 Pt's states no medical or surgical changes since previsit or office visit.

## 2024-04-19 NOTE — Op Note (Signed)
 [facility] Patient Name: Melinda Smith Procedure Date: 04/19/2024 3:56 PM MRN: [id number] Endoscopist: Landon Pinion P. General [place] , [place], [id number] Age: 43 Referring MD:  Date of Birth: Nov 18, 1981 Gender: Female Account #: [id number] Procedure:                Colonoscopy Indications:              High risk colon cancer surveillance: Personal                            history of colonic polyps - advanced adenoma                            removed 2014, 2 small adenomas 2017, 3 adenomas 2022 Medicines:                Monitored Anesthesia Care Procedure:                Pre-Anesthesia Assessment:                           - Prior to the procedure, a History and Physical                            was performed, and patient medications and                            allergies were reviewed. The patient's tolerance of                            previous anesthesia was also reviewed. The risks                            and benefits of the procedure and the sedation                            options and risks were discussed with the patient.                            All questions were answered, and informed consent                            was obtained. Prior Anticoagulants: The patient has                            taken no anticoagulant or antiplatelet agents. ASA                            Grade Assessment: II - A patient with mild systemic                            disease. After reviewing the risks and benefits,                            the patient was deemed in satisfactory condition to  undergo the procedure.                           After obtaining informed consent, the colonoscope                            was passed under direct vision. Throughout the                            procedure, the patient's blood pressure, pulse, and                            oxygen saturations were monitored continuously. The                            Olympus  CF-HQ190L ([id number]) Colonoscope was                            introduced through the anus and advanced to the the                            cecum, identified by appendiceal orifice and                            ileocecal valve. The colonoscopy was performed                            without difficulty. The patient tolerated the                            procedure well. The quality of the bowel                            preparation was good. The ileocecal valve,                            appendiceal orifice, and rectum were photographed. Scope In: 4:03:31 PM Scope Out: 4:20:05 PM Scope Withdrawal Time: 0 hours 11 minutes 48 seconds  Total Procedure Duration: 0 hours 16 minutes 34 seconds  Findings:                 The perianal and digital rectal examinations were                            normal.                           A few small-mouthed diverticula were found in the                            left colon and right colon.                           A 3 mm polyp was found in the recto-sigmoid colon.  The polyp was sessile. The polyp was removed with a                            cold snare. Resection and retrieval were complete.                           Internal hemorrhoids were found during                            retroflexion. The hemorrhoids were small.                           The exam was otherwise without abnormality. Complications:            No immediate complications. Estimated blood loss:                            Minimal. Estimated Blood Loss:     Estimated blood loss was minimal. Impression:               - Diverticulosis in the left colon and in the right                            colon.                           - One 3 mm polyp at the recto-sigmoid colon,                            removed with a cold snare. Resected and retrieved.                           - Internal hemorrhoids.                           - The examination was  otherwise normal. Recommendation:           - Patient has a contact number available for                            emergencies. The signs and symptoms of potential                            delayed complications were discussed with the                            patient. Return to normal activities tomorrow.                            Written discharge instructions were provided to the                            patient.                           - Resume previous diet.                           -  Continue present medications.                           - Await pathology results. Anticipate repeat                            colonoscopy in 5 years given history of advanced                            adenoma and multiple adenomas removed at a young age Lendon Queen. Melinda Economos, MD 04/19/2024 4:25:00 PM This report has been signed electronically.

## 2024-04-20 ENCOUNTER — Telehealth: Payer: Self-pay

## 2024-04-20 NOTE — Telephone Encounter (Signed)
  Follow up Call-     04/19/2024    2:48 PM 08/27/2023    7:35 AM  Call back number  Post procedure Call Back phone  # [phone]  Permission to leave phone message Yes Yes     Patient questions:  Do you have a fever, pain , or abdominal swelling? No. Pain Score  0 *  Have you tolerated food without any problems? Yes.    Have you been able to return to your normal activities? Yes.    Do you have any questions about your discharge instructions: Diet   No. Medications  No. Follow up visit  No.  Do you have questions or concerns about your Care? No.  Actions: * If pain score is 4 or above: No action needed, pain <4.

## 2024-04-24 LAB — SURGICAL PATHOLOGY

## 2024-04-26 ENCOUNTER — Encounter: Payer: Self-pay | Admitting: Gastroenterology

## 2024-07-02 ENCOUNTER — Other Ambulatory Visit: Payer: Self-pay | Admitting: Gastroenterology

## 2024-10-04 ENCOUNTER — Other Ambulatory Visit: Payer: Self-pay | Admitting: Gastroenterology

## 2024-10-28 ENCOUNTER — Other Ambulatory Visit: Payer: Self-pay | Admitting: Gastroenterology

## 2024-12-02 ENCOUNTER — Other Ambulatory Visit: Payer: Self-pay | Admitting: Gastroenterology

## 2025-01-17 ENCOUNTER — Other Ambulatory Visit: Payer: Self-pay | Admitting: Gastroenterology

## 2025-01-21 ENCOUNTER — Other Ambulatory Visit: Payer: Self-pay | Admitting: Gastroenterology

## 2025-01-25 ENCOUNTER — Encounter: Payer: Self-pay | Admitting: Gastroenterology
